# Patient Record
Sex: Female | Born: 1980 | Race: White | Hispanic: Yes | Smoking: Never smoker
Health system: Southern US, Community
[De-identification: ages and names within clinical notes are randomized; demographics above are authoritative.]

## PROBLEM LIST (undated history)

## (undated) DIAGNOSIS — F419 Anxiety disorder, unspecified: Secondary | ICD-10-CM

## (undated) DIAGNOSIS — I10 Essential (primary) hypertension: Secondary | ICD-10-CM

## (undated) HISTORY — DX: Anxiety disorder, unspecified: F41.9

## (undated) HISTORY — DX: Essential (primary) hypertension: I10

---

## 2010-03-29 DIAGNOSIS — F419 Anxiety disorder, unspecified: Secondary | ICD-10-CM

## 2010-03-29 HISTORY — DX: Anxiety disorder, unspecified: F41.9

## 2011-03-30 DIAGNOSIS — I10 Essential (primary) hypertension: Secondary | ICD-10-CM

## 2011-03-30 HISTORY — PX: TUBAL LIGATION: SHX77

## 2011-03-30 HISTORY — DX: Essential (primary) hypertension: I10

## 2016-01-28 ENCOUNTER — Ambulatory Visit: Payer: Self-pay | Admitting: Internal Medicine

## 2016-03-11 ENCOUNTER — Encounter: Payer: Self-pay | Admitting: Internal Medicine

## 2016-03-11 ENCOUNTER — Ambulatory Visit (INDEPENDENT_AMBULATORY_CARE_PROVIDER_SITE_OTHER): Payer: Self-pay | Admitting: Internal Medicine

## 2016-03-11 VITALS — BP 120/82 | HR 86 | Resp 12 | Ht 64.0 in | Wt 182.0 lb

## 2016-03-11 DIAGNOSIS — F419 Anxiety disorder, unspecified: Secondary | ICD-10-CM

## 2016-03-11 DIAGNOSIS — K029 Dental caries, unspecified: Secondary | ICD-10-CM

## 2016-03-11 DIAGNOSIS — F410 Panic disorder [episodic paroxysmal anxiety] without agoraphobia: Secondary | ICD-10-CM

## 2016-03-11 DIAGNOSIS — H547 Unspecified visual loss: Secondary | ICD-10-CM

## 2016-03-11 DIAGNOSIS — I10 Essential (primary) hypertension: Secondary | ICD-10-CM

## 2016-03-11 NOTE — Patient Instructions (Signed)
Call if you do not hear from me about your need for follow up in 6 weeks--need to get your records first regarding the problem in your right lung and your record for last pap smear

## 2016-03-11 NOTE — Progress Notes (Signed)
Subjective:    Patient ID: Patricia Brooks, female    DOB: Jul 09, 1980, 35 y.o.   MRN: [id number]  HPI   Here to establish Maritza's daughter  1.  Essential Hypertension:  Diagnosed 5 years ago after birth of daughter. Was taking Lisinopril 10 mg daily, but has been out for 7 months.  2.  Anxiety:  Describes panic attacks twice monthly. Hands shake, sweats a lot, chest pain, choking sensation and shortness of breath.  States her eyes get really red.   If has nowhere to go to lie down and be quiet alone, can last all day.  Generally occurs when at work.   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  Took unknown medication as needed for the panic episodes.  She does not have anything now to show us to ascertain name of medication, but sounds like a benzodiazepine.   Describes developing severe depression after last child born.  Treated with counseling, yoga, women's group in church.   She does not feel depressed currently. Does cry as she misses [place].  Wanted to buy a home for her children.  Too expensive in [place]  [facility]/11/2016  Decreased Interest 0  Down, Depressed, Hopeless 1  PHQ - 2 Score 1  Altered sleeping 2  Tired, decreased energy 3  Change in appetite 0  Feeling bad or failure about yourself  0  Trouble concentrating 0  Moving slowly or fidgety/restless 2  Suicidal thoughts 0  PHQ-9 Score 8    3.  Decreased visual acuity:  Would like referral to Optometry  No outpatient prescriptions have been marked as taking for the 03/11/16 encounter (Office Visit) with Julieanne MansonElizabeth Davine Coba, MD.   Allergies  Allergen Reactions  . Aspirin Swelling and Rash   Past Medical History:  Diagnosis Date  . Anxiety 2012   after last pregnancy  . Hypertension 2013   Previously on Lisinopril   Past Surgical History:  Procedure Laterality Date  . CESAREAN SECTION  2007, 2013  . TUBAL LIGATION  2013   [place]    Family History  Problem Relation Age of Onset  . Seizures Mother   . Kidney Stones Sister   . Blindness Son     Not clear if blind or just with poor vision  . Migraines Son   . Stroke Sister     Recurrent from valvular issue per patient   Social History   Social History  . Marital status: Divorced    Spouse name: N/A  . Number of children: 2  . Years of education: 12+   Occupational History  . Compare shelf stocker and meat dept.    Social History Main Topics  . Smoking status: Never Smoker  . Smokeless tobacco: Never Used  . Alcohol use No  . Drug use: No  . Sexual activity: Not Currently   Other Topics Concern  . Not on file   Social History Narrative   Originally from [place]   [place]   Lives at home with son and daughter.   [place] [facility] [place]              Review of Systems     Objective:   Physical Exam  NAD HEENT:  PERRL, EOMI, discs sharp, TMs pearly gray, throat without injection, some dental decay. Neck:  Supple, no adenopathy, no thyromegaly Chest:  CTA CV:  RRR with normal S1 and S2, No S3, S4 or murmur, radial and DP pulses normal and equal. LE: No edema         Assessment & Plan:  1.  History of Essential Hypertension:  No findings of elevated BP now off meds for 7 months.  May be related to anxiety.  Follow for now.  2.  Panic Attacks/anxiety:  Referral to [facility] for counseling/behavior modification.  Reticent to start medication as she does not feel depressed currently.  Discussed SSRI med is first line for panic disorder and depression.  3.  Dental Decay:  Dental referral  4.  Decreased visual acuity:  Optometry referral.

## 2016-04-07 ENCOUNTER — Telehealth: Payer: Self-pay | Admitting: Licensed Clinical Social Worker

## 2016-04-08 ENCOUNTER — Telehealth: Payer: Self-pay | Admitting: Licensed Clinical Social Worker

## 2016-04-08 NOTE — Telephone Encounter (Signed)
Social Work Intern called [facility] to offer counseling services. Services were declined by [facility]. She stated she was still interested in dental care. It was difficult to hear [facility] due to quality of service.

## 2016-05-24 ENCOUNTER — Ambulatory Visit (INDEPENDENT_AMBULATORY_CARE_PROVIDER_SITE_OTHER): Payer: Self-pay | Admitting: Internal Medicine

## 2016-05-24 ENCOUNTER — Encounter: Payer: Self-pay | Admitting: Internal Medicine

## 2016-05-24 VITALS — BP 150/100 | HR 72 | Resp 12 | Ht 65.0 in | Wt 178.0 lb

## 2016-05-24 DIAGNOSIS — Z124 Encounter for screening for malignant neoplasm of cervix: Secondary | ICD-10-CM

## 2016-05-24 DIAGNOSIS — B9689 Other specified bacterial agents as the cause of diseases classified elsewhere: Secondary | ICD-10-CM

## 2016-05-24 DIAGNOSIS — Z1231 Encounter for screening mammogram for malignant neoplasm of breast: Secondary | ICD-10-CM

## 2016-05-24 DIAGNOSIS — Z1239 Encounter for other screening for malignant neoplasm of breast: Secondary | ICD-10-CM

## 2016-05-24 DIAGNOSIS — R030 Elevated blood-pressure reading, without diagnosis of hypertension: Secondary | ICD-10-CM

## 2016-05-24 DIAGNOSIS — N750 Cyst of Bartholin's gland: Secondary | ICD-10-CM

## 2016-05-24 DIAGNOSIS — Z113 Encounter for screening for infections with a predominantly sexual mode of transmission: Secondary | ICD-10-CM

## 2016-05-24 DIAGNOSIS — N760 Acute vaginitis: Secondary | ICD-10-CM

## 2016-05-24 LAB — POCT WET PREP WITH KOH
KOH Prep POC: NEGATIVE
RBC Wet Prep HPF POC: NEGATIVE
TRICHOMONAS UA: NEGATIVE
YEAST WET PREP PER HPF POC: NEGATIVE

## 2016-05-24 MED ORDER — METRONIDAZOLE 500 MG PO TABS: ORAL_TABLET | ORAL | 0 refills | Status: DC

## 2016-05-24 NOTE — Progress Notes (Signed)
   Subjective:    Patient ID: Patricia Brooks, female    DOB: [birth year], 36 y.o.   MRN: [id number]  HPI   1.  Panic Attacks/Anxiety:  Never had time to get counseling.  States this is a non issue now.  2.  Has become sexually active with a boyfriend of about 1 month.  Has developed a cyst in her left labia.  Has what sounds like a history of cyst removed in same area.   Last pap was 4 years ago after the birth of her youngest. Thinks she had a hysterectomy, but has regular periods.  Previously gave me history that she had BTL, but she states that is not what she remembers now. Have sent for old records. Has not had a breast exam in last year. Has never had an abnormal pap. Has had a mammogram:  Cannot recall when, but had mammogram in 2017 as mother had history of breast cancer at her age Kandis Cocking(Maritza), which I was not aware of (in [place] per patient).   No outpatient prescriptions have been marked as taking for the 05/24/16 encounter (Office Visit) with Julieanne MansonElizabeth Julius Matus, MD.    Allergies  Allergen Reactions  . Aspirin Swelling and Rash      Review of Systems     Objective:   Physical Exam  Lungs:  CTA Breasts:  No focal mass, skin dimpling or axillary adenopathy CV:  RRR with normal S1 and S2, No S3, S4, or murmur, radial and DP pulses normal and equal Abd:  S, NT, No HSM or mass, + BS.  Well healed low transverse surgical scar. GU:  2 cmx 1.5 cm ovoid cystic lesion palpated deep to left labia majora.  The labia is swollen, but no erythema.  A bit tender to deep palpation. White thin vaginal discharge with amine odor, No underlying erythema Cervix without lesion. No CMT.  Pap taken.  No uterine or adnexal mass or tenderness.          Assessment & Plan:  1.  Pap and Breast exam today:  Not clear about family history.  Patient unwilling to clarify with her mother.  Doesn't want to get in her "business"  Kandis CockingMaritza is currently traveling and unable to contact.  2.  BV:   Metronidazole 500 mg twice daily for 7 days.  3.  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

## 2016-05-25 LAB — RPR QUALITATIVE: RPR Ser Ql: NONREACTIVE

## 2016-05-25 LAB — HIV ANTIBODY (ROUTINE TESTING W REFLEX): HIV SCREEN 4TH GENERATION: NONREACTIVE

## 2016-05-26 LAB — GC/CHLAMYDIA PROBE AMP
CHLAMYDIA, DNA PROBE: NEGATIVE
Neisseria gonorrhoeae by PCR: NEGATIVE

## 2016-05-26 LAB — CYTOLOGY - PAP

## 2016-06-07 ENCOUNTER — Encounter: Payer: Self-pay | Admitting: Internal Medicine

## 2016-06-10 ENCOUNTER — Ambulatory Visit: Payer: Self-pay | Admitting: Internal Medicine

## 2016-10-07 NOTE — Addendum Note (Signed)
Addended by: Marcelino FreestoneHAZLEY, Talah Cookston on: 10/07/2016 04:46 PM   Modules accepted: Orders

## 2016-11-22 ENCOUNTER — Encounter: Payer: Self-pay | Admitting: Obstetrics and Gynecology

## 2017-03-17 ENCOUNTER — Other Ambulatory Visit: Payer: Self-pay

## 2017-03-17 ENCOUNTER — Ambulatory Visit (INDEPENDENT_AMBULATORY_CARE_PROVIDER_SITE_OTHER): Payer: Self-pay | Admitting: Physician Assistant

## 2017-03-17 ENCOUNTER — Encounter (INDEPENDENT_AMBULATORY_CARE_PROVIDER_SITE_OTHER): Payer: Self-pay | Admitting: Physician Assistant

## 2017-03-17 VITALS — BP 159/107 | HR 84 | Temp 98.2°F | Ht 64.5 in | Wt 167.8 lb

## 2017-03-17 DIAGNOSIS — I10 Essential (primary) hypertension: Secondary | ICD-10-CM

## 2017-03-17 DIAGNOSIS — Q339 Congenital malformation of lung, unspecified: Secondary | ICD-10-CM

## 2017-03-17 DIAGNOSIS — J069 Acute upper respiratory infection, unspecified: Secondary | ICD-10-CM

## 2017-03-17 DIAGNOSIS — F418 Other specified anxiety disorders: Secondary | ICD-10-CM

## 2017-03-17 MED ORDER — LISINOPRIL-HYDROCHLOROTHIAZIDE 10-12.5 MG PO TABS: 1.0000 | ORAL_TABLET | Freq: Every day | ORAL | 3 refills | Status: DC

## 2017-03-17 MED ORDER — ESCITALOPRAM OXALATE 10 MG PO TABS: 10.0000 mg | ORAL_TABLET | Freq: Every day | ORAL | 1 refills | Status: DC

## 2017-03-17 MED ORDER — PHENYLEPHRINE-DM-GG-APAP 5-10-200-325 MG/10ML PO LIQD: 20.0000 mL | ORAL | 0 refills | Status: AC

## 2017-03-17 MED FILL — LISINOPRIL-HCTZ 10-12.5 MG: 10-12.5 | 30 days supply | Qty: 30 | Fill #0

## 2017-03-17 MED FILL — ESCITALOPRAM 10 MG TABLET: 10 | 30 days supply | Qty: 30 | Fill #0

## 2017-03-17 NOTE — Progress Notes (Signed)
Subjective:  Patient ID: Patricia Brooks, female    DOB: [birth year]  Age: 36 y.o. MRN: [id number]  CC: HTN and nasal congestion  HPI Patricia Brooks is a 36 y.o. female with a medical history of anxiety, panic attacks, and HTN presents as a new patient with complaint of nasal congestion x2 days. No other symptoms reported. No close contacts with the same.    Has had few occurrences of panic attacks. Attributed to the death of her grandfather whom died in her arms after performing CPR. She feels her heart palpitating, feels nervous, and feels like screaming. She also used to work 4 jobs for many months in order to "reach a goal". During that time she had only slept one hour a day. Quit all her jobs except one after her goal was met. Currently working 40 hours a week and spending more time with her children. Used to take escitalopram which was helpful in reducing anxiety. However, she does not think she is anxious. Thought escitalopram was for HTN. Denies suicidality and says it was in error that she marked suicidal on the PHQ9.    Says she did a CXR ordered by her previous provider and was found to have a suspicious lung lesion. She is worried she may have a serious problem as she occasionally feels short of breath. Does not know exactly what was found nor does she have records for review.       Outpatient Medications Prior to Visit  Medication Sig Dispense Refill  . metroNIDAZOLE (FLAGYL) 500 MG tablet 1 tab by mouth twice daily for 7 days. (Patient not taking: Reported on 03/17/2017) 14 tablet 0   No facility-administered medications prior to visit.      ROS Review of Systems  Constitutional: Negative for chills, fever and malaise/fatigue.  Eyes: Negative for blurred vision.  Respiratory: Negative for shortness of breath.   Cardiovascular: Negative for chest pain and palpitations.  Gastrointestinal: Negative for abdominal pain and nausea.  Genitourinary: Negative for dysuria and  hematuria.  Musculoskeletal: Negative for joint pain and myalgias.  Skin: Negative for rash.  Neurological: Negative for tingling and headaches.  Psychiatric/Behavioral: Negative for depression. The patient is not nervous/anxious.     Objective:  BP (!) 159/107 (BP Location: Right Arm, Patient Position: Sitting, Cuff Size: Normal)   Pulse 84   Temp 98.2 F (36.8 C) (Oral)   Ht 5' 4.5" (1.638 m)   Wt 167 lb 12.8 oz (76.1 kg)   LMP 02/01/2017 (Approximate)   SpO2 100%   BMI 28.36 kg/m   BP/Weight 03/17/2017 05/24/2016 41/96/2229  Systolic BP 798 921 194  Diastolic BP 174 081 82  Wt. (Lbs) 167.8 178 182  BMI 28.36 29.62 31.24      Physical Exam  Constitutional: She is oriented to person, place, and time.  Well developed, well nourished, NAD, polite  HENT:  Head: Normocephalic and atraumatic.  Eyes: No scleral icterus.  Neck: Normal range of motion. Neck supple. No thyromegaly present.  Cardiovascular: Normal rate, regular rhythm and normal heart sounds.  Pulmonary/Chest: Effort normal and breath sounds normal.  Musculoskeletal: She exhibits no edema.  Neurological: She is alert and oriented to person, place, and time. No cranial nerve deficit. Coordination normal.  Skin: Skin is warm and dry. No rash noted. No erythema. No pallor.  Psychiatric: Her behavior is normal. Thought content normal.  Poor judgement and insight. Defensive and contradictory. Seems mildly anxious.   Vitals reviewed.    Assessment & Plan:  1. Depression with anxiety - escitalopram (LEXAPRO) 10 MG tablet; Take 1 tablet (10 mg total) by mouth daily.  Dispense: 30 tablet; Refill: 1 - CBC with Differential - Comprehensive metabolic panel - TSH  2. Hypertension, unspecified type - lisinopril-hydrochlorothiazide (PRINZIDE,ZESTORETIC) 10-12.5 MG tablet; Take 1 tablet by mouth daily.  Dispense: 90 tablet; Refill: 3 - CBC with Differential - Comprehensive metabolic panel - TSH  3. Acute upper  respiratory infection - Phenylephrine-DM-GG-APAP (MUCINEX FAST-MAX COLD FLU) 5-10-200-325 MG/10ML LIQD; Take 20 mLs by mouth every 4 (four) hours for 5 days.  Dispense: 1 Bottle; Refill: 0  4. Lung anomaly - DG Chest 2 View; Future - I have asked patient to fill a medical record release authorization so that her first imaging and report could be faxed to [place].    *Pt is very difficult to work with. Has very poor insight and knowledge. Very defensive and does not readily accept medical opinion. I have advised that she take escitalopram as it seems to have helped her before when prescribed by another provider.   Meds ordered this encounter  Medications  . lisinopril-hydrochlorothiazide (PRINZIDE,ZESTORETIC) 10-12.5 MG tablet    Sig: Take 1 tablet by mouth daily.    Dispense:  90 tablet    Refill:  3    Order Specific Question:   Supervising Provider    Answer:   Tresa Garter [id number]  . escitalopram (LEXAPRO) 10 MG tablet    Sig: Take 1 tablet (10 mg total) by mouth daily.    Dispense:  30 tablet    Refill:  1    Order Specific Question:   Supervising Provider    Answer:   Tresa Garter [id number]  . Phenylephrine-DM-GG-APAP (MUCINEX FAST-MAX COLD FLU) 5-10-200-325 MG/10ML LIQD    Sig: Take 20 mLs by mouth every 4 (four) hours for 5 days.    Dispense:  1 Bottle    Refill:  0    Order Specific Question:   Supervising Provider    Answer:   Tresa Garter [id number]    Follow-up: Return in about 6 weeks (around 04/28/2017) for anxiety.   Clent Demark PA

## 2017-03-17 NOTE — Patient Instructions (Signed)
Trastorno de ansiedad generalizada, en adultos  Generalized Anxiety Disorder, Adult  El trastorno de ansiedad generalizada (TAG) es un trastorno de salud mental. Las personas con esta afeccin se preocupan constantemente por los eventos de todos los das. A diferencia de la ansiedad normal, la preocupacin relacionada con el TAG no se produce por un evento especfico. Estas preocupaciones tampoco desaparecen ni mejoran con el tiempo. EL TAG interfiere con las funciones de la vida, incluidas las relaciones, el trabajo y la escuela.  El TAG puede variar de leve a grave. Las personas con TAG grave tienen intensas oleadas de ansiedad con sntomas fsicos (crisis de angustia).  Cules son las causas?  Se desconoce la causa exacta del TAG.  Qu incrementa el riesgo?  Es ms probable que esta afeccin se manifieste en:   Mujeres.   Las personas que tienen antecedentes familiares de trastornos de ansiedad.   Las personas que son muy tmidas.   Las personas que experimentan eventos muy estresantes en la vida, tales como la muerte de un ser querido.   Las personas que tienen un entorno familiar muy estresante.    Cules son los signos o los sntomas?  Con frecuencia, las personas que padecen el TAG se preocupan excesivamente por muchas cosas en la vida, tales como su salud y su familia. Tambin pueden experimentar una preocupacin desmedida por lo siguiente:   Hacer las cosas bien.   Llegar a tiempo.   Los desastres naturales.   Las amistades.    Los sntomas fsicos del TAG incluyen:   Fatiga.   Tensin muscular o contracciones musculares.   Temblores o agitacin.   Sobresaltarse con facilidad.   Sentir que el corazn late fuerte o est acelerado.   Sentir falta de aire o como que no se puede respirar profundamente.   Problemas para quedarse dormido o para seguir durmiendo.   Sudoracin.   Nuseas, diarrea o sndrome del intestino irritable (SII).   Dolores de cabeza.   Dificultad para concentrarse o  recordar hechos.   Agitacin.   Irritabilidad.    Cmo se diagnostica?  El mdico puede diagnosticar el TAG en funcin de los sntomas y los antecedentes mdicos. Se le realizar un examen fsico. El mdico le har preguntas especficas sobre sus sntomas, que incluyen qu tan graves son, cundo comenzaron y si van y vienen. Tambin puede hacerle preguntas sobre el consumo de alcohol o drogas, incluidos los medicamentos recetados. Su mdico podr derivarlo a un especialista de salud mental para ms evaluaciones.  Su mdico le realizar un examen exhaustivo y puede hacer pruebas adicionales para descartar otras posibles causas de sus sntomas.  Para recibir un diagnstico del TAG, una persona debe tener ansiedad que:   Est fuera de su control.   Afecte distintos aspectos de su vida, como el trabajo y las relaciones.   Cause angustia que le impida participar en sus actividades habituales.   Incluya al menos tres sntomas fsicos del TAG tales como inquietud, fatiga, dificultad para concentrarse, irritabilidad, tensin muscular o problemas para dormir.    Antes de que su mdico pueda confirmar un diagnstico del TAG, estos sntomas deben estar presentes ms das de los que no lo estn y deben tener una duracin de seis meses o ms.  Cmo se trata?  Las siguientes terapias se usan generalmente para tratar el TAG:   Medicamentos. Por lo general, los medicamentos antidepresivos se recetan para un control diario a largo plazo. Se pueden agregar medicamentos para la ansiedad en casos   graves, especialmente cuando ocurren crisis de angustia.   Psicoterapia (psicoanlisis). Determinados tipos de psicoterapia pueden ser tiles para tratar el TAG al brindar apoyo, educacin y orientacin. Entre las opciones se incluyen las siguientes:  ? Terapia cognitivo conductual (TCC). Las personas aprenden habilidades para sobrellevar situaciones y tcnicas para aliviar la ansiedad. Aprenden a identificar conductas y pensamientos  irreales o negativos, y a reemplazarlos por positivos.  ? Terapia de aceptacin y compromiso (acceptance and commitment therapy, ACT). Este tratamiento ensea a las personas a ser conscientes como una forma de lidiar con pensamientos y sentimientos no deseados.  ? Biorretroalimentacin. Este proceso lo capacita para controlar la respuesta del cuerpo (respuesta psicolgica) a travs de tcnicas de respiracin y mtodos de relajacin. Usted trabajar con un terapeuta mientras se usan mquinas para controlar sus sntomas fsicos.   Tcnicas para controlar el estrs. Estas incluyen yoga, meditacin y ejercicio.    Un especialista en salud mental puede ayudar a determinar qu tratamiento es el mejor para usted. Algunas personas pueden mejorar con solo un tipo de terapia. Sin embargo, otras personas requieren una combinacin de terapias.  Siga estas instrucciones en su casa:   Tome los medicamentos de venta libre y los recetados solamente como se lo haya indicado el mdico.   Trate de mantener una rutina normal.   Trate de anticipar situaciones estresantes y permita un tiempo adicional para controlarlas.   Participe de cualquier tcnica para autocalmarse o controlar el estrs segn las indicaciones de su mdico.   No se castigue ante los retrocesos o por no realizar progresos.   Trate de reconocer sus logros aunque sean pequeos.   Concurra a todas las visitas de seguimiento como se lo haya indicado el mdico. Esto es importante.  Comunquese con un mdico si:   Los sntomas no mejoran.   Los sntomas empeoran.   Tiene signos de depresin tales como:  ? Tristeza, mal humor o irritabilidad.  ? Ya no disfruta de actividades que le solan causar placer.  ? Cambios en el peso y o en sus hbitos de alimentacin.  ? Cambios en los hbitos de sueo.  ? Evita a amigos y familiares.  ? No tiene energa para realizar las tareas habituales.  ? Tiene sentimientos de culpa o de minusvala.  Solicite ayuda de inmediato  si:   Tiene pensamientos serios acerca de lastimarse a usted mismo o daar a otras personas.  Si alguna vez siente que puede lastimarse o lastimar a los dems, o piensa en poner fin a su vida, busque ayuda de inmediato. Puede dirigirse al servicio de urgencias ms cercano o comunicarse con:   El servicio de emergencias de su localidad (911 en los [place]).   Una lnea de asistencia al suicida y atencin en crisis, como la Lnea Nacional de Prevencin del Suicidio (National Suicide Prevention Lifeline) al 1-800-273-8255. Est disponible las 24 horas del da.    Resumen   El trastorno de ansiedad generalizada (TAG) es un trastorno de salud mental que implica preocupacin no causada por un evento especfico.   Con frecuencia, las personas que padecen el TAG se preocupan excesivamente por muchas cosas en la vida, tales como su salud y su familia.   El TAG puede causar sntomas fsicos tales como inquietud, dificultad para concentrarse, problemas para dormir, sudoracin frecuente, nuseas, diarrea, dolores de cabeza y temblores, o contracciones musculares.   Un especialista en salud mental puede ayudar a determinar qu tratamiento es el mejor para usted. Algunas personas pueden mejorar   con solo un tipo de terapia. Sin embargo, otras personas requieren una combinacin de terapias.  Esta informacin no tiene como fin reemplazar el consejo del mdico. Asegrese de hacerle al mdico cualquier pregunta que tenga.  Document Released: 07/10/2012 Document Revised: 06/18/2016 Document Reviewed: 06/18/2016  Elsevier Interactive Patient Education  2018 [employer]

## 2017-03-18 ENCOUNTER — Other Ambulatory Visit (INDEPENDENT_AMBULATORY_CARE_PROVIDER_SITE_OTHER): Payer: Self-pay | Admitting: Physician Assistant

## 2017-03-18 ENCOUNTER — Telehealth (INDEPENDENT_AMBULATORY_CARE_PROVIDER_SITE_OTHER): Payer: Self-pay

## 2017-03-18 DIAGNOSIS — D509 Iron deficiency anemia, unspecified: Secondary | ICD-10-CM

## 2017-03-18 LAB — CBC WITH DIFFERENTIAL/PLATELET
Basophils Absolute: 0 10*3/uL (ref 0.0–0.2)
Basos: 1 %
EOS (ABSOLUTE): 0.1 10*3/uL (ref 0.0–0.4)
EOS: 3 %
HEMATOCRIT: 33.2 % — AB (ref 34.0–46.6)
HEMOGLOBIN: 9.9 g/dL — AB (ref 11.1–15.9)
Immature Grans (Abs): 0 10*3/uL (ref 0.0–0.1)
Immature Granulocytes: 0 %
LYMPHS ABS: 0.9 10*3/uL (ref 0.7–3.1)
Lymphs: 21 %
MCH: 21.9 pg — AB (ref 26.6–33.0)
MCHC: 29.8 g/dL — AB (ref 31.5–35.7)
MCV: 73 fL — AB (ref 79–97)
MONOCYTES: 13 %
Monocytes Absolute: 0.6 10*3/uL (ref 0.1–0.9)
NEUTROS ABS: 2.7 10*3/uL (ref 1.4–7.0)
Neutrophils: 62 %
Platelets: 258 10*3/uL (ref 150–379)
RBC: 4.53 x10E6/uL (ref 3.77–5.28)
RDW: 17.6 % — ABNORMAL HIGH (ref 12.3–15.4)
WBC: 4.4 10*3/uL (ref 3.4–10.8)

## 2017-03-18 LAB — COMPREHENSIVE METABOLIC PANEL
ALBUMIN: 4.5 g/dL (ref 3.5–5.5)
ALK PHOS: 61 IU/L (ref 39–117)
ALT: 12 IU/L (ref 0–32)
AST: 15 IU/L (ref 0–40)
Albumin/Globulin Ratio: 1.4 (ref 1.2–2.2)
BILIRUBIN TOTAL: 0.2 mg/dL (ref 0.0–1.2)
BUN / CREAT RATIO: 12 (ref 9–23)
BUN: 8 mg/dL (ref 6–20)
CHLORIDE: 104 mmol/L (ref 96–106)
CO2: 24 mmol/L (ref 20–29)
Calcium: 9.3 mg/dL (ref 8.7–10.2)
Creatinine, Ser: 0.65 mg/dL (ref 0.57–1.00)
GFR calc Af Amer: 132 mL/min/{1.73_m2} (ref >59)
GFR calc non Af Amer: 115 mL/min/{1.73_m2} (ref >59)
GLOBULIN, TOTAL: 3.2 g/dL (ref 1.5–4.5)
Glucose: 99 mg/dL (ref 65–99)
Potassium: 4.2 mmol/L (ref 3.5–5.2)
SODIUM: 140 mmol/L (ref 134–144)
Total Protein: 7.7 g/dL (ref 6.0–8.5)

## 2017-03-18 LAB — TSH: TSH: 1.67 u[IU]/mL (ref 0.450–4.500)

## 2017-03-18 MED ORDER — FERROUS SULFATE 325 (65 FE) MG PO TBEC: 325.0000 mg | DELAYED_RELEASE_TABLET | Freq: Three times a day (TID) | ORAL | 0 refills | Status: DC

## 2017-03-18 NOTE — Telephone Encounter (Signed)
-----   Message from Loletta Specteroger David Gomez, PA-C sent at 03/18/2017  8:51 AM EST ----- Patient is anemic. I sent iron pills to [facility].

## 2017-03-18 NOTE — Telephone Encounter (Signed)
Left a message informing patient of being anemic and iron pills being sent to [facility] pharmacy and to call office with ay questions. Maryjean Mornempestt S Christell Steinmiller, CMA

## 2017-04-28 ENCOUNTER — Ambulatory Visit (INDEPENDENT_AMBULATORY_CARE_PROVIDER_SITE_OTHER): Payer: Self-pay | Admitting: Physician Assistant

## 2017-06-16 ENCOUNTER — Ambulatory Visit (INDEPENDENT_AMBULATORY_CARE_PROVIDER_SITE_OTHER): Payer: Self-pay | Admitting: Physician Assistant

## 2017-06-16 ENCOUNTER — Other Ambulatory Visit: Payer: Self-pay

## 2017-06-16 ENCOUNTER — Encounter (INDEPENDENT_AMBULATORY_CARE_PROVIDER_SITE_OTHER): Payer: Self-pay | Admitting: Physician Assistant

## 2017-06-16 VITALS — BP 173/108 | HR 70 | Temp 98.2°F | Wt 163.8 lb

## 2017-06-16 DIAGNOSIS — Z91199 Patient's noncompliance with other medical treatment and regimen due to unspecified reason: Secondary | ICD-10-CM

## 2017-06-16 DIAGNOSIS — N750 Cyst of Bartholin's gland: Secondary | ICD-10-CM

## 2017-06-16 DIAGNOSIS — F419 Anxiety disorder, unspecified: Secondary | ICD-10-CM

## 2017-06-16 DIAGNOSIS — Z9119 Patient's noncompliance with other medical treatment and regimen: Secondary | ICD-10-CM

## 2017-06-16 DIAGNOSIS — I10 Essential (primary) hypertension: Secondary | ICD-10-CM

## 2017-06-16 MED ORDER — ESCITALOPRAM OXALATE 10 MG PO TABS: 10.0000 mg | ORAL_TABLET | Freq: Every day | ORAL | 1 refills | Status: DC

## 2017-06-16 MED ORDER — DOXYCYCLINE HYCLATE 100 MG PO CAPS: 100.0000 mg | ORAL_CAPSULE | Freq: Two times a day (BID) | ORAL | 0 refills | Status: DC

## 2017-06-16 MED ORDER — CLONAZEPAM 0.25 MG PO TBDP: 0.2500 mg | ORAL_TABLET | Freq: Two times a day (BID) | ORAL | 0 refills | Status: DC

## 2017-06-16 MED ORDER — LISINOPRIL-HYDROCHLOROTHIAZIDE 10-12.5 MG PO TABS: 1.0000 | ORAL_TABLET | Freq: Every day | ORAL | 3 refills | Status: DC

## 2017-06-16 NOTE — Patient Instructions (Signed)

## 2017-06-16 NOTE — Progress Notes (Signed)
Subjective:  Patient ID: Patricia Brooks, female    DOB: June 12, 1980  Age: 37 y.o. MRN: [id number]  CC: blister on vulva  HPI Patricia Brooks is a 37 y.o. female with a medical history of anxiety and HTN presents with complaint of blister on the vulva and a growth on the left labia majora. Patient is fearful she has a recurrence of a Bartholin gland cyst. Complains of dyspareunia and pain on contact but otherwise painless. No suppuration or bleeding of the cyst. No vaginal drainage, pelvic pains, or constitutional symptoms.     Patient also reports she is not taking her escitalopram or clonazepam. Said clonazepam had not been dispensed to her. She thought if she did not take clonazepam then there was no use in taking escitalopram.       Outpatient Medications Prior to Visit  Medication Sig Dispense Refill  . ferrous sulfate 325 (65 FE) MG EC tablet Take 1 tablet (325 mg total) by mouth 3 (three) times daily with meals. 90 tablet 0  . escitalopram (LEXAPRO) 10 MG tablet Take 1 tablet (10 mg total) by mouth daily. (Patient not taking: Reported on 06/16/2017) 30 tablet 1  . lisinopril-hydrochlorothiazide (PRINZIDE,ZESTORETIC) 10-12.5 MG tablet Take 1 tablet by mouth daily. (Patient not taking: Reported on 06/16/2017) 90 tablet 3   No facility-administered medications prior to visit.      ROS Review of Systems  Constitutional: Negative for chills, fever and malaise/fatigue.  Eyes: Negative for blurred vision.  Respiratory: Negative for shortness of breath.   Cardiovascular: Negative for chest pain and palpitations.  Gastrointestinal: Negative for abdominal pain and nausea.  Genitourinary: Negative for dysuria and hematuria.       Vaginal discomfort  Musculoskeletal: Negative for joint pain and myalgias.  Skin: Negative for rash.  Neurological: Negative for tingling and headaches.  Psychiatric/Behavioral: Negative for depression. The patient is nervous/anxious.     Objective:  BP (!)  173/108 (BP Location: Left Arm, Patient Position: Sitting, Cuff Size: Normal)   Pulse 70   Temp 98.2 F (36.8 C) (Oral)   Wt 163 lb 12.8 oz (74.3 kg)   LMP 06/09/2017 (Exact Date)   SpO2 100%   BMI 27.68 kg/m   BP/Weight 06/16/2017 03/17/2017 05/24/2016  Systolic BP 173 159 150  Diastolic BP 108 107 100  Wt. (Lbs) 163.8 167.8 178  BMI 27.68 28.36 29.62      Physical Exam  Constitutional: She is oriented to person, place, and time.  Well developed, well nourished, NAD, polite  HENT:  Head: Normocephalic and atraumatic.  Eyes: No scleral icterus.  Cardiovascular: Normal rate, regular rhythm and normal heart sounds.  Pulmonary/Chest: Effort normal.  Genitourinary:  Genitourinary Comments: Left sided bartholin gland cyst with mild TTP, no suppuration, no bleeding. Small skin tag on left labia majora  Musculoskeletal: She exhibits no edema.  Neurological: She is alert and oriented to person, place, and time.  Skin: Skin is warm and dry. No rash noted. No erythema. No pallor.  Psychiatric: Thought content normal.  Somewhat nervous  Vitals reviewed.    Assessment & Plan:    1. Bartholin gland cyst - doxycycline (VIBRAMYCIN) 100 MG capsule; Take 1 capsule (100 mg total) by mouth 2 (two) times daily.  Dispense: 20 capsule; Refill: 0 - referral to Gynecology  2. Hypertension, unspecified type - lisinopril-hydrochlorothiazide (PRINZIDE,ZESTORETIC) 10-12.5 MG tablet; Take 1 tablet by mouth daily.  Dispense: 90 tablet; Refill: 3  3. Anxiety - escitalopram (LEXAPRO) 10 MG tablet; Take 1 tablet (  10 mg total) by mouth daily.  Dispense: 90 tablet; Refill: 1 - clonazePAM (KLONOPIN) 0.25 MG disintegrating tablet; Take 1 tablet (0.25 mg total) by mouth 2 (two) times daily.  Dispense: 30 tablet; Refill: 0  4. Noncompliance - Advised patient to take medications as directed.    Meds ordered this encounter  Medications  . lisinopril-hydrochlorothiazide (PRINZIDE,ZESTORETIC) 10-12.5 MG  tablet    Sig: Take 1 tablet by mouth daily.    Dispense:  90 tablet    Refill:  3    Order Specific Question:   Supervising Provider    Answer:   Quentin Angst [id number]  . escitalopram (LEXAPRO) 10 MG tablet    Sig: Take 1 tablet (10 mg total) by mouth daily.    Dispense:  90 tablet    Refill:  1    Order Specific Question:   Supervising Provider    Answer:   Quentin Angst [id number]  . clonazePAM (KLONOPIN) 0.25 MG disintegrating tablet    Sig: Take 1 tablet (0.25 mg total) by mouth 2 (two) times daily.    Dispense:  30 tablet    Refill:  0    Order Specific Question:   Supervising Provider    Answer:   Quentin Angst [id number]  . doxycycline (VIBRAMYCIN) 100 MG capsule    Sig: Take 1 capsule (100 mg total) by mouth 2 (two) times daily.    Dispense:  20 capsule    Refill:  0    Order Specific Question:   Supervising Provider    Answer:   Quentin Angst [[id number]]    Follow-up: Return in about 1 month (around 07/14/2017) for HTN.   Loletta Specter PA

## 2017-07-21 ENCOUNTER — Ambulatory Visit (INDEPENDENT_AMBULATORY_CARE_PROVIDER_SITE_OTHER): Payer: Self-pay | Admitting: Physician Assistant

## 2017-09-02 ENCOUNTER — Emergency Department (HOSPITAL_COMMUNITY)
Admission: EM | Admit: 2017-09-02 | Discharge: 2017-09-02 | Disposition: A | Discharge status: Home / Self Care | Payer: Self-pay | Attending: Physician Assistant | Admitting: Physician Assistant

## 2017-09-02 ENCOUNTER — Other Ambulatory Visit: Payer: Self-pay

## 2017-09-02 ENCOUNTER — Emergency Department (HOSPITAL_COMMUNITY): Payer: Self-pay

## 2017-09-02 ENCOUNTER — Encounter (HOSPITAL_COMMUNITY): Payer: Self-pay | Admitting: *Deleted

## 2017-09-02 DIAGNOSIS — F410 Panic disorder [episodic paroxysmal anxiety] without agoraphobia: Secondary | ICD-10-CM | POA: Insufficient documentation

## 2017-09-02 DIAGNOSIS — F419 Anxiety disorder, unspecified: Secondary | ICD-10-CM

## 2017-09-02 DIAGNOSIS — I10 Essential (primary) hypertension: Secondary | ICD-10-CM | POA: Insufficient documentation

## 2017-09-02 DIAGNOSIS — Z79899 Other long term (current) drug therapy: Secondary | ICD-10-CM | POA: Insufficient documentation

## 2017-09-02 LAB — URINALYSIS, ROUTINE W REFLEX MICROSCOPIC
BILIRUBIN URINE: NEGATIVE
GLUCOSE, UA: NEGATIVE mg/dL
Ketones, ur: NEGATIVE mg/dL
LEUKOCYTES UA: NEGATIVE
NITRITE: NEGATIVE
PROTEIN: NEGATIVE mg/dL
Specific Gravity, Urine: 1.008 (ref 1.005–1.030)
pH: 7 (ref 5.0–8.0)

## 2017-09-02 LAB — BASIC METABOLIC PANEL
Anion gap: 10 (ref 5–15)
BUN: 6 mg/dL (ref 6–20)
CO2: 26 mmol/L (ref 22–32)
Calcium: 9.2 mg/dL (ref 8.9–10.3)
Chloride: 104 mmol/L (ref 101–111)
Creatinine, Ser: 0.65 mg/dL (ref 0.44–1.00)
GFR calc Af Amer: >60 mL/min (ref >60)
GLUCOSE: 86 mg/dL (ref 65–99)
POTASSIUM: 3 mmol/L — AB (ref 3.5–5.1)
Sodium: 140 mmol/L (ref 135–145)

## 2017-09-02 LAB — CBC
HEMATOCRIT: 34.8 % — AB (ref 36.0–46.0)
Hemoglobin: 10.4 g/dL — ABNORMAL LOW (ref 12.0–15.0)
MCH: 23 pg — AB (ref 26.0–34.0)
MCHC: 29.9 g/dL — AB (ref 30.0–36.0)
MCV: 76.8 fL — AB (ref 78.0–100.0)
Platelets: 299 10*3/uL (ref 150–400)
RBC: 4.53 MIL/uL (ref 3.87–5.11)
RDW: 17 % — AB (ref 11.5–15.5)
WBC: 4.3 10*3/uL (ref 4.0–10.5)

## 2017-09-02 LAB — RAPID URINE DRUG SCREEN, HOSP PERFORMED
Amphetamines: NOT DETECTED
Barbiturates: NOT DETECTED
Benzodiazepines: NOT DETECTED
Cocaine: NOT DETECTED
OPIATES: NOT DETECTED
TETRAHYDROCANNABINOL: NOT DETECTED

## 2017-09-02 LAB — I-STAT TROPONIN, ED: Troponin i, poc: 0 ng/mL (ref 0.00–0.08)

## 2017-09-02 LAB — I-STAT BETA HCG BLOOD, ED (MC, WL, AP ONLY): I-stat hCG, quantitative: <5 m[IU]/mL (ref <5)

## 2017-09-02 MED ORDER — ESCITALOPRAM OXALATE 10 MG PO TABS: 10.0000 mg | ORAL_TABLET | Freq: Every day | ORAL | 0 refills | Status: DC

## 2017-09-02 MED ORDER — POTASSIUM CHLORIDE CRYS ER 20 MEQ PO TBCR
40.0000 meq | EXTENDED_RELEASE_TABLET | Freq: Once | ORAL | Status: AC
  Administered 2017-09-02: 40 meq via ORAL
  Filled 2017-09-02: qty 2

## 2017-09-02 NOTE — ED Triage Notes (Signed)
Pt in stating she has multiple episodes of anxiety and hypertension, states she did not take her BP medication this am and started having anxiety attack at work, also concerned that her BP was elevated, pt reports she had an episode of blurred vision while at work today, pt states she couldn't feel her feet, pt was hyperventilating during this episode, pt was hyperventilating and crying on arrival to ED, unable to control her behavior- pt has calmed down at this time and is speaking in normal sentences and states she feels better. Pt did take her mothers BP medication today, which is different than the medication that she normally takes but she said it helped her calm down.

## 2017-09-02 NOTE — Discharge Instructions (Addendum)
You were seen today after an episode of chest pain, shortness of breath, nausea. This was most likely from anxiety. We are glad that all your symptoms have resolved.  Your heart and lungs looks normal here.  Your blood work is also normal except for low potassium you have received an extra potassium pill while you are here.  You will need to resume your anxiety medicine.  We have given you a refill for 30 days.  Please see your PCP as soon as possible for visit to discuss anxiety and blood pressure. Take care!
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
## 2017-09-05 MED FILL — DOXYCYCLINE 100 MG TABLET: 100 | 10 days supply | Qty: 20 | Fill #0

## 2017-09-05 MED FILL — ESCITALOPRAM 10 MG TABLET: 10 | 30 days supply | Qty: 30 | Fill #0

## 2017-09-05 MED FILL — LISINOPRIL-HCTZ 10-12.5 MG: 10-12.5 | 30 days supply | Qty: 30 | Fill #0

## 2017-09-12 ENCOUNTER — Ambulatory Visit: Payer: Self-pay

## 2018-06-12 IMAGING — DX DG CHEST 2V
2 series · 2 of 2 positions shown · non-contrast
Comparison: None.

CLINICAL DATA: Shortness of breath and chest pressure

EXAM:
CHEST - 2 VIEW

[w chest pa]
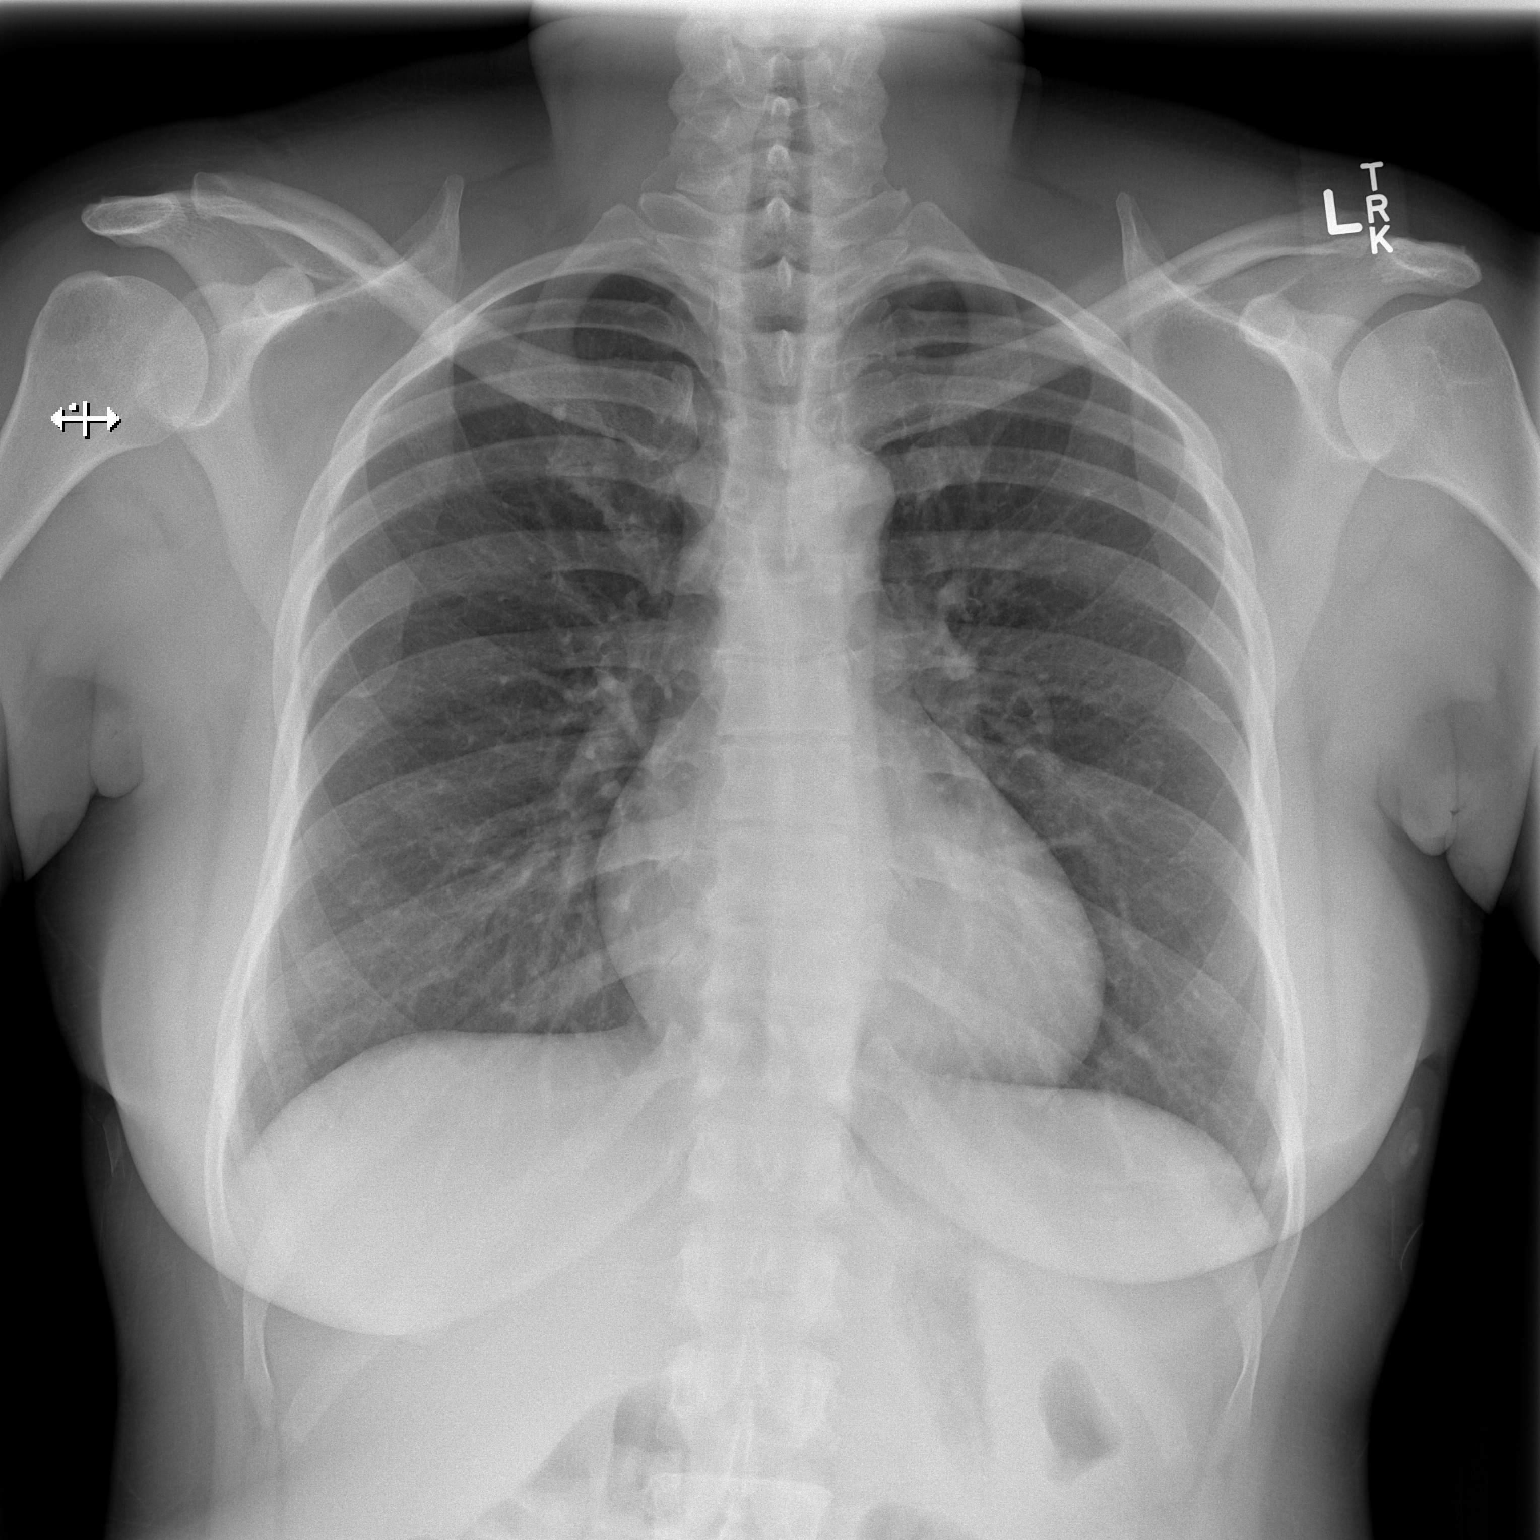

[w chest lat]
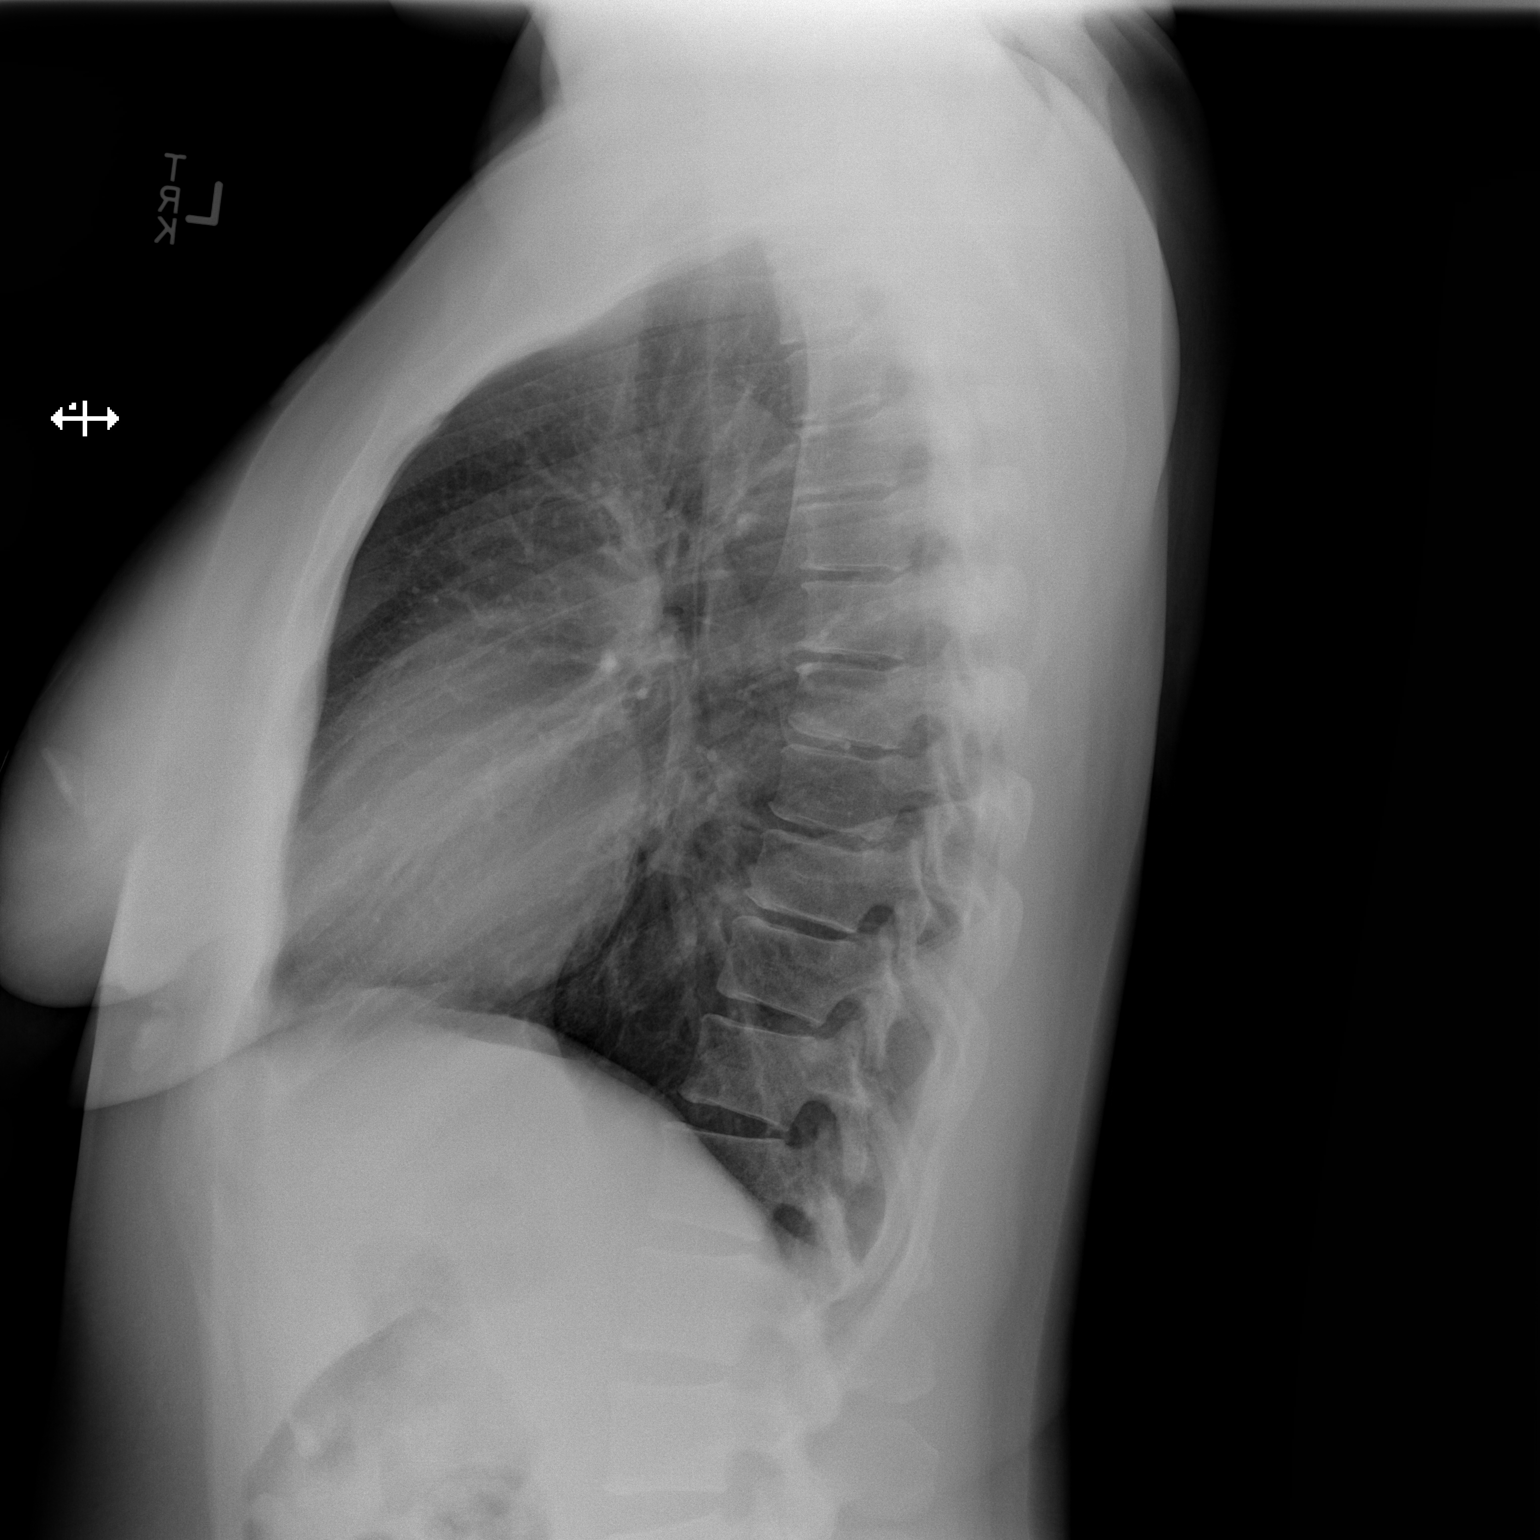

[2 of 2 positions shown; findings below may reference images not displayed]

FINDINGS: Lungs are clear. Heart size and pulmonary vascularity are normal. No
adenopathy. No bone lesions. No pneumothorax.
IMPRESSION: No edema or consolidation.

## 2018-07-20 ENCOUNTER — Other Ambulatory Visit: Payer: Self-pay

## 2018-07-20 ENCOUNTER — Ambulatory Visit: Payer: Self-pay | Attending: Primary Care | Admitting: Primary Care

## 2018-07-20 NOTE — Progress Notes (Signed)
Medical Assistant used [employer] to contact patient.  Patient was not available, [employer] left patient a voicemail. Voicemail states to give a call back to [place] with [facility] at [phone].

## 2019-03-08 ENCOUNTER — Other Ambulatory Visit: Payer: Self-pay

## 2019-03-08 ENCOUNTER — Ambulatory Visit (INDEPENDENT_AMBULATORY_CARE_PROVIDER_SITE_OTHER): Payer: Self-pay | Admitting: Primary Care

## 2019-03-08 ENCOUNTER — Encounter (INDEPENDENT_AMBULATORY_CARE_PROVIDER_SITE_OTHER): Payer: Self-pay | Admitting: Primary Care

## 2019-03-08 VITALS — BP 160/109 | HR 87 | Temp 97.3°F | Ht 65.0 in | Wt 179.4 lb

## 2019-03-08 DIAGNOSIS — I10 Essential (primary) hypertension: Secondary | ICD-10-CM

## 2019-03-08 DIAGNOSIS — E663 Overweight: Secondary | ICD-10-CM

## 2019-03-08 DIAGNOSIS — Z6829 Body mass index (BMI) 29.0-29.9, adult: Secondary | ICD-10-CM

## 2019-03-08 DIAGNOSIS — Z23 Encounter for immunization: Secondary | ICD-10-CM

## 2019-03-08 MED ORDER — CLONIDINE HCL 0.1 MG PO TABS
0.1000 mg | ORAL_TABLET | Freq: Once | ORAL | Status: AC
  Administered 2019-03-08: 11:00:00 0.1 mg via ORAL

## 2019-03-08 MED ORDER — LISINOPRIL-HYDROCHLOROTHIAZIDE 20-25 MG PO TABS: 1.0000 | ORAL_TABLET | Freq: Every day | ORAL | 3 refills | Status: DC

## 2019-03-08 MED FILL — LISINOPRIL-HCTZ 20-25 MG TA: 20-25 | 30 days supply | Qty: 30 | Fill #0

## 2019-03-08 NOTE — Patient Instructions (Signed)
Cmo controlar su hipertensin Managing Your Hypertension La hipertensin se denomina usualmente presin arterial alta. Ocurre cuando la sangre presiona contra las paredes de las arterias con demasiada fuerza. Las arterias son los vasos sanguneos que transportan la sangre desde el corazn hacia todas las partes del cuerpo. La hipertensin hace que el corazn haga ms esfuerzo para Chiropodist y [employer] que las arterias se Teacher, music o Advertising account executive. La hipertensin no tratada o no controlada puede causar infarto de miocardio, accidente cerebrovascular, enfermedad renal y otros problemas. Qu son las Futures trader de presin arterial? Una lectura de la presin arterial consiste de un nmero ms alto sobre un nmero ms bajo. En condiciones ideales, la presin arterial debe estar por debajo de 120/80. El primer nmero ("superior") es la presin sistlica. Es la medida de la presin de las arterias cuando el corazn late. El segundo nmero ("inferior") es la presin diastlica. Es la medida de la presin en las arterias cuando el corazn se relaja. Qu significa mi lectura de presin arterial? La presin arterial se clasifica en cuatro etapas. Sobre la base de la lectura de su presin arterial, el mdico puede usar las siguientes etapas para determinar si necesita tratamiento y de qu tipo. La presin sistlica y la presin diastlica se miden en una unidad llamada mm Hg. Normal  Presin sistlica: por debajo de 409.  Presin diastlica: por debajo de 80. Elevada  Presin sistlica: 811-914.  Presin diastlica: por debajo de 80. Etapa 1 de hipertensin  Presin sistlica: 782-956.  Presin diastlica: 21-30. Etapa 2 de hipertensin  Presin sistlica: 865 o ms.  Presin diastlica: 90 o ms. Cules son los riesgos para la salud asociados con la hipertensin? Controlar la hipertensin es una responsabilidad importante. La hipertensin no controlada puede causar:  Infarto de  miocardio.  Accidente cerebrovascular.  Debilitamiento de los vasos sanguneos (aneurisma).  Insuficiencia cardaca.  Dao renal.  Dao ocular.  Sndrome metablico.  Problemas de memoria y concentracin. Qu cambios puedo hacer para controlar mi hipertensin? La hipertensin se puede controlar haciendo [employer] estilo de vida y, posiblemente, tomando medicamentos. Su mdico le ayudar a crear un plan para bajar la presin arterial al rango normal. Comida y bebida   Siga una dieta con alto contenido de fibras y [place], y con bajo contenido de sal (sodio), azcar agregada y Daphene Jaeger. Un ejemplo de plan alimenticio es la dieta DASH (Dietary Approaches to Stop Hypertension, Mtodos alimenticios para detener la hipertensin). Para alimentarse de esta manera: ? Coma mucha fruta y [place]. Trate de que la mitad del plato de cada comida sea de frutas y verduras. ? Coma cereales integrales, como pasta integral, arroz integral y pan integral. Llene aproximadamente un cuarto del plato con cereales integrales. ? Consuma productos lcteos con bajo contenido de grasa. ? Evite la ingesta de cortes de carne grasa, carne procesada o curada, y carne de ave con piel. Llene aproximadamente un cuarto del plato con protenas magras, como pescado, pollo sin piel, frijoles, huevos y tofu. ? Evite ingerir alimentos prehechos o procesados. En general, estos tienen mayor cantidad de sodio, azcar agregada y Wendee Copp.  Reduzca su ingesta diaria de sodio. La mayora de las personas que tienen hipertensin deben comer menos de 1500 mg de sodio por [employer].  Limite el consumo de alcohol a no ms de 1 medida por da si es mujer y no est Music therapist y a 2 medidas por da si es hombre. Una medida equivale a 12onzas de cerveza, Public house manager de vino  o 1onzas de bebidas alcohlicas de alta graduacin. Estilo de vida  Trabaje con su mdico para mantener un peso saludable o perder peso. Pregntele cual es su peso  recomendado.  Realice al menos 30 minutos de ejercicio que haga que se acelere su corazn (ejercicio aerbico) la mayora de los das de la semana. Estas actividades pueden incluir caminar, nadar o andar en bicicleta.  Incluya ejercicios para fortalecer sus msculos (ejercicios de resistencia), como levantamiento de pesas, como parte de su rutina semanal de ejercicios. Intente realizar 30minutos de este tipo de ejercicios al menos tres das a la semana.  No consuma ningn producto que contenga nicotina o tabaco, como cigarrillos y cigarrillos electrnicos. Si necesita ayuda para dejar de fumar, consulte al mdico.  Controle las enfermedades a largo plazo (crnicas), como el colesterol alto o la diabetes. Control  Contrlese la presin arterial en su casa segn las indicaciones del mdico. La presin arterial deseada puede variar en funcin de las enfermedades, la edad y otros factores personales.  Contrlese la presin arterial de manera regular, en la frecuencia indicada por su mdico. Trabaje con su mdico  Revise con su mdico todos los medicamentos que toma ya que puede haber efectos secundarios o interacciones.  Hable con su mdico acerca de la dieta, hbitos de ejercicio y otros factores del estilo de vida que pueden contribuir a la hipertensin.  Consulte a su mdico regularmente. Su mdico puede ayudarle a crear y ajustar su plan para controlar la hipertensin. Debo tomar un medicamento para controlar mi presin arterial? El mdico puede recetarle medicamentos si los cambios en el estilo de vida no son suficientes para lograr controlar la presin arterial y si:  Su presin arterial sistlica es de 130 o ms.  Su presin arterial diastlica es de 80 o ms. Tome los medicamentos solamente como se lo haya indicado el mdico. Siga cuidadosamente las indicaciones. Los medicamentos para la presin arterial deben tomarse segn las indicaciones. Los medicamentos pierden eficacia al  omitir las dosis. El hecho de omitir las dosis tambin aumenta el riesgo de otros problemas. Comunquese con un mdico si:  Piensa que tiene una reaccin alrgica a los medicamentos que ha tomado.  Tiene dolores de cabeza frecuentes (recurrentes).  Siente mareos.  Tiene hinchazn en los tobillos.  Tiene problemas de visin. Solicite ayuda de inmediato si:  Siente un dolor de cabeza intenso o confusin.  Siente debilidad inusual, adormecimiento o que se desmayar.  Siente un dolor intenso en el pecho o el abdomen.  Vomita repetidas veces.  Tiene dificultad para respirar. Resumen  La hipertensin se produce cuando la sangre bombea en las arterias con mucha fuerza. Si esta afeccin no se controla, podra correr riesgo de tener complicaciones graves.  La presin arterial deseada puede variar en funcin de las enfermedades, la edad y otros factores personales. Para la mayora de las personas, una presin arterial normal es menor que 120/80.  La hipertensin se puede controlar mediante cambios en el estilo de vida, tomando medicamentos, o ambas cosas. Los cambios en el estilo de vida incluyen prdida de peso, ingerir alimentos sanos, seguir una dieta baja en sodio, hacer ms ejercicio y limitar el consumo de alcohol. Esta informacin no tiene como fin reemplazar el consejo del mdico. Asegrese de hacerle al mdico cualquier pregunta que tenga. Document Released: 12/08/2011 Document Revised: 05/24/2016 Document Reviewed: 02/25/2016 Elsevier Patient Education  2020 Elsevier Inc.  

## 2019-03-08 NOTE — Progress Notes (Addendum)
Established Patient Office Visit  Subjective:  Patient ID: Patricia Brooks, female    DOB: 03/13/1981  Age: 38 y.o. MRN: [id number]  CC:  Chief Complaint  Patient presents with  . Establish Care    hypertension    HPI Patricia QuailYessenia Fackler presents for establish care with new PCP but established with [facility]. She presents today for management of blood pressure . Bp 197/115 she endorses having  shortness of breath, headaches, chest pain no lower extremity edema. She has been off Bp meds since 08/2017  Past Medical History:  Diagnosis Date  . Anxiety 2012   after last pregnancy  . Hypertension 2013   Previously on Lisinopril    Past Surgical History:  Procedure Laterality Date  . CESAREAN SECTION  2007, 2013  . TUBAL LIGATION  2013   [place]    Family History  Problem Relation Age of Onset  . Seizures Mother   . Kidney Stones Sister   . Blindness Son        Not clear if blind or just with poor vision  . Migraines Son   . Stroke Sister        Recurrent from valvular issue per patient    Social History   Socioeconomic History  . Marital status: Divorced    Spouse name: Not on file  . Number of children: 2  . Years of education: 12+  . Highest education level: Not on file  Occupational History  . Occupation: AcupuncturistCompare shelf stocker and meat dept.  Tobacco Use  . Smoking status: Never Smoker  . Smokeless tobacco: Never Used  Substance and Sexual Activity  . Alcohol use: No  . Drug use: No  . Sexual activity: Not Currently  Other Topics Concern  . Not on file  Social History Narrative   Originally from [place]   [place]   Lives at home with son and daughter.   [place]   Social Determinants of Health   Financial Resource Strain:   . Difficulty of Paying Living Expenses: Not on file  Food Insecurity:   . Worried About Programme researcher, broadcasting/film/videounning Out of Food in the Last Year: Not on file  . Ran Out of Food in the Last Year: Not on file   Transportation Needs:   . Lack of Transportation (Medical): Not on file  . Lack of Transportation (Non-Medical): Not on file  Physical Activity:   . Days of Exercise per Week: Not on file  . Minutes of Exercise per Session: Not on file  Stress:   . Feeling of Stress : Not on file  Social Connections:   . Frequency of Communication with Friends and Family: Not on file  . Frequency of Social Gatherings with Friends and Family: Not on file  . Attends Religious Services: Not on file  . Active Member of Clubs or Organizations: Not on file  . Attends BankerClub or Organization Meetings: Not on file  . Marital Status: Not on file  Intimate Partner Violence:   . Fear of Current or Ex-Partner: Not on file  . Emotionally Abused: Not on file  . Physically Abused: Not on file  . Sexually Abused: Not on file    Outpatient Medications Prior to Visit  Medication Sig Dispense Refill  . clonazePAM (KLONOPIN) 0.25 MG disintegrating tablet Take 1 tablet (0.25 mg total) by mouth 2 (two) times daily. (Patient not taking: Reported on 03/08/2019) 30 tablet 0  . escitalopram (LEXAPRO)  10 MG tablet Take 1 tablet (10 mg total) by mouth daily. (Patient not taking: Reported on 03/08/2019) 30 tablet 0  . ferrous sulfate 325 (65 FE) MG EC tablet Take 1 tablet (325 mg total) by mouth 3 (three) times daily with meals. (Patient not taking: Reported on 03/08/2019) 90 tablet 0  . doxycycline (VIBRAMYCIN) 100 MG capsule Take 1 capsule (100 mg total) by mouth 2 (two) times daily. (Patient not taking: Reported on 09/02/2017) 20 capsule 0  . lisinopril-hydrochlorothiazide (PRINZIDE,ZESTORETIC) 10-12.5 MG tablet Take 1 tablet by mouth daily. (Patient not taking: Reported on 03/08/2019) 90 tablet 3   No facility-administered medications prior to visit.    Allergies  Allergen Reactions  . Aspirin Swelling and Rash    ROS Review of Systems  Eyes: Positive for visual disturbance.  Respiratory: Positive for shortness of  breath.   Cardiovascular: Positive for chest pain.  Neurological: Positive for syncope and headaches.  All other systems reviewed and are negative.     Objective:    Physical Exam  Constitutional: She is oriented to person, place, and time. She appears well-developed and well-nourished.  HENT:  Head: Normocephalic.  Eyes: Pupils are equal, round, and reactive to light. EOM are normal.  Cardiovascular: Normal rate and regular rhythm.  Pulmonary/Chest: Effort normal and breath sounds normal.  Abdominal: Soft. Bowel sounds are normal.  Musculoskeletal:        General: Normal range of motion.     Cervical back: Normal range of motion and neck supple.  Neurological: She is oriented to person, place, and time.  Skin: Skin is warm and dry.  Psychiatric: She has a normal mood and affect. Her behavior is normal. Judgment and thought content normal.    BP (!) 160/109 (BP Location: Right Arm, Patient Position: Sitting, Cuff Size: Normal)   Pulse 87   Temp (!) 97.3 F (36.3 C) (Temporal)   Ht 5\' 5"  (1.651 m)   Wt 179 lb 6.4 oz (81.4 kg)   LMP 03/01/2019 (Approximate)   SpO2 99%   BMI 29.85 kg/m  Wt Readings from Last 3 Encounters:  03/08/19 179 lb 6.4 oz (81.4 kg)  06/16/17 163 lb 12.8 oz (74.3 kg)  03/17/17 167 lb 12.8 oz (76.1 kg)     Health Maintenance Due  Topic Date Due  . PAP SMEAR-Modifier  05/25/2019    There are no preventive care reminders to display for this patient.  Lab Results  Component Value Date   TSH 1.670 03/17/2017   Lab Results  Component Value Date   WBC 3.5 03/08/2019   HGB 11.8 03/08/2019   HCT 36.6 03/08/2019   MCV 82 03/08/2019   PLT 273 03/08/2019   Lab Results  Component Value Date   NA 143 03/08/2019   K 3.9 03/08/2019   CO2 26 03/08/2019   GLUCOSE 95 03/08/2019   BUN 11 03/08/2019   CREATININE 0.68 03/08/2019   BILITOT 0.2 03/08/2019   ALKPHOS 80 03/08/2019   AST 14 03/08/2019   ALT 15 03/08/2019   PROT 7.6 03/08/2019    ALBUMIN 4.4 03/08/2019   CALCIUM 9.3 03/08/2019   ANIONGAP 10 09/02/2017   Lab Results  Component Value Date   CHOL 175 03/08/2019   Lab Results  Component Value Date   HDL 69 03/08/2019   Lab Results  Component Value Date   LDLCALC 93 03/08/2019   Lab Results  Component Value Date   TRIG 71 03/08/2019   Lab Results  Component Value Date  CHOLHDL 2.5 03/08/2019   No results found for: HGBA1C    Assessment & Plan:  Emilene was seen today for establish care.  Diagnoses and all orders for this visit:  Need for Tdap vaccination -     Tdap vaccine greater than or equal to 7yo IM  Hypertension, unspecified type Counseled on blood pressure goal of less than 130/80, low-sodium, medication compliance, 150 minutes of moderate intensity exercise per week. In office treated with  -     cloNIDine (CATAPRES) tablet 0.1 mg. In office. Prescribed lisinopril 20/HCTZ 25 take daily   Body mass index (BMI) 29.0-29.9, adult/Overweight (BMI 25.0-29.9) Discussed diet and exercise for person with BMI >25. Instructed: You must burn more calories than you eat. Losing 5 percent of your body weight should be considered a success. In the longer term, losing more than 15 percent of your body weight and staying at this weight is an extremely good result. However, keep in mind that even losing 5 percent of your body weight leads to important health benefits, so try not to get discouraged if you're not able to lose more than this. Will recheck weight in 3-6 months.   Meds ordered this encounter  Medications  . cloNIDine (CATAPRES) tablet 0.1 mg  . lisinopril-hydrochlorothiazide (ZESTORETIC) 20-25 MG tablet    Sig: Take 1 tablet by mouth daily.    Dispense:  90 tablet    Refill:  3    Follow-up: Return for 6-8 weeks Bp follow up tele.    Grayce Sessions, NP

## 2019-03-09 ENCOUNTER — Telehealth (INDEPENDENT_AMBULATORY_CARE_PROVIDER_SITE_OTHER): Payer: Self-pay

## 2019-03-09 LAB — CBC WITH DIFFERENTIAL/PLATELET
Basophils Absolute: 0 10*3/uL (ref 0.0–0.2)
Basos: 1 %
EOS (ABSOLUTE): 0.1 10*3/uL (ref 0.0–0.4)
Eos: 3 %
Hematocrit: 36.6 % (ref 34.0–46.6)
Hemoglobin: 11.8 g/dL (ref 11.1–15.9)
Immature Grans (Abs): 0 10*3/uL (ref 0.0–0.1)
Immature Granulocytes: 0 %
Lymphocytes Absolute: 1.2 10*3/uL (ref 0.7–3.1)
Lymphs: 34 %
MCH: 26.4 pg — ABNORMAL LOW (ref 26.6–33.0)
MCHC: 32.2 g/dL (ref 31.5–35.7)
MCV: 82 fL (ref 79–97)
Monocytes Absolute: 0.4 10*3/uL (ref 0.1–0.9)
Monocytes: 13 %
Neutrophils Absolute: 1.7 10*3/uL (ref 1.4–7.0)
Neutrophils: 49 %
Platelets: 273 10*3/uL (ref 150–450)
RBC: 4.47 x10E6/uL (ref 3.77–5.28)
RDW: 13.7 % (ref 11.7–15.4)
WBC: 3.5 10*3/uL (ref 3.4–10.8)

## 2019-03-09 LAB — CMP14+EGFR
ALT: 15 IU/L (ref 0–32)
AST: 14 IU/L (ref 0–40)
Albumin/Globulin Ratio: 1.4 (ref 1.2–2.2)
Albumin: 4.4 g/dL (ref 3.8–4.8)
Alkaline Phosphatase: 80 IU/L (ref 39–117)
BUN/Creatinine Ratio: 16 (ref 9–23)
BUN: 11 mg/dL (ref 6–20)
Bilirubin Total: 0.2 mg/dL (ref 0.0–1.2)
CO2: 26 mmol/L (ref 20–29)
Calcium: 9.3 mg/dL (ref 8.7–10.2)
Chloride: 104 mmol/L (ref 96–106)
Creatinine, Ser: 0.68 mg/dL (ref 0.57–1.00)
GFR calc Af Amer: 128 mL/min/{1.73_m2} (ref >59)
GFR calc non Af Amer: 111 mL/min/{1.73_m2} (ref >59)
Globulin, Total: 3.2 g/dL (ref 1.5–4.5)
Glucose: 95 mg/dL (ref 65–99)
Potassium: 3.9 mmol/L (ref 3.5–5.2)
Sodium: 143 mmol/L (ref 134–144)
Total Protein: 7.6 g/dL (ref 6.0–8.5)

## 2019-03-09 LAB — LIPID PANEL
Chol/HDL Ratio: 2.5 ratio (ref 0.0–4.4)
Cholesterol, Total: 175 mg/dL (ref 100–199)
HDL: 69 mg/dL (ref >39)
LDL Chol Calc (NIH): 93 mg/dL (ref 0–99)
Triglycerides: 71 mg/dL (ref 0–149)
VLDL Cholesterol Cal: 13 mg/dL (ref 5–40)

## 2019-03-09 NOTE — Telephone Encounter (Signed)
-----   Message from Kerin Perna, NP sent at 03/09/2019  9:08 AM EST ----- I have reviewed all labs and they are normal

## 2019-03-09 NOTE — Telephone Encounter (Signed)
Call placed using pacific interpreter [phone]) left voicemail informing patient that labs are normal. Return call to [facility] at [phone] with any questions or concerns. Nat Christen, CMA

## 2019-05-03 ENCOUNTER — Ambulatory Visit (INDEPENDENT_AMBULATORY_CARE_PROVIDER_SITE_OTHER): Payer: Self-pay | Admitting: Primary Care

## 2020-01-17 ENCOUNTER — Other Ambulatory Visit: Payer: Self-pay

## 2020-01-17 ENCOUNTER — Encounter (INDEPENDENT_AMBULATORY_CARE_PROVIDER_SITE_OTHER): Payer: Self-pay | Admitting: Primary Care

## 2020-01-17 ENCOUNTER — Ambulatory Visit (INDEPENDENT_AMBULATORY_CARE_PROVIDER_SITE_OTHER): Payer: Self-pay | Admitting: Primary Care

## 2020-01-17 ENCOUNTER — Other Ambulatory Visit (INDEPENDENT_AMBULATORY_CARE_PROVIDER_SITE_OTHER): Payer: Self-pay | Admitting: Primary Care

## 2020-01-17 VITALS — BP 156/104 | HR 73 | Temp 97.3°F | Ht 65.0 in | Wt 178.2 lb

## 2020-01-17 DIAGNOSIS — I10 Essential (primary) hypertension: Secondary | ICD-10-CM

## 2020-01-17 DIAGNOSIS — Z23 Encounter for immunization: Secondary | ICD-10-CM

## 2020-01-17 DIAGNOSIS — N921 Excessive and frequent menstruation with irregular cycle: Secondary | ICD-10-CM

## 2020-01-17 DIAGNOSIS — Z13220 Encounter for screening for lipoid disorders: Secondary | ICD-10-CM

## 2020-01-17 MED ORDER — AMLODIPINE BESYLATE 10 MG PO TABS: 10.0000 mg | ORAL_TABLET | Freq: Every day | ORAL | 1 refills | Status: DC

## 2020-01-17 MED ORDER — LISINOPRIL-HYDROCHLOROTHIAZIDE 20-25 MG PO TABS: 1.0000 | ORAL_TABLET | Freq: Every day | ORAL | 1 refills | Status: DC

## 2020-01-17 MED FILL — LISINOPRIL-HYDROCHLOROTHIAZ: 20-25 | 30 days supply | Qty: 30 | Fill #0

## 2020-01-17 MED FILL — AMLODIPINE BESYLATE 10 MG T: 10 | 30 days supply | Qty: 30 | Fill #0

## 2020-01-17 NOTE — Patient Instructions (Signed)
Hipertensión en los adultos °Hypertension, Adult °La presión arterial alta (hipertensión) se produce cuando la fuerza de la sangre bombea a través de las arterias con mucha fuerza. Las arterias son los vasos sanguíneos que transportan la sangre desde el corazón al resto del cuerpo. La hipertensión hace que el corazón haga más esfuerzo para bombear sangre y puede provocar que las arterias se estrechen o endurezcan. La hipertensión no tratada o no controlada puede causar infarto de miocardio, insuficiencia cardíaca, accidente cerebrovascular, enfermedad renal y otros problemas. °Una lectura de la presión arterial consta de un número más alto sobre un número más bajo. En condiciones ideales, la presión arterial debe estar por debajo de 120/80. El primer número (“superior”) es la presión sistólica. Es la medida de la presión de las arterias cuando el corazón late. El segundo número (“inferior”) es la presión diastólica. Es la medida de la presión en las arterias cuando el corazón se relaja. °¿Cuáles son las causas? °Se desconoce la causa exacta de esta afección. Hay algunas afecciones que causan presión arterial alta o están relacionadas con ella. °¿Qué incrementa el riesgo? °Algunos factores de riesgo de hipertensión están bajo su control. Los siguientes factores pueden hacer que sea más propenso a desarrollar esta afección: °· Fumar. °· Tener diabetes mellitus tipo 2, colesterol alto, o ambos. °· No hacer la cantidad suficiente de actividad física o ejercicio. °· Tener sobrepeso. °· Consumir mucha grasa, azúcar, calorías o sal (sodio) en su dieta. °· Beber alcohol en exceso. °Algunos factores de riesgo para la presión arterial alta pueden ser difíciles o imposibles de cambiar. Algunos de estos factores son los siguientes: °· Tener enfermedad renal crónica. °· Tener antecedentes familiares de presión arterial alta. °· Edad. Los riesgos aumentan con la edad. °· Raza. El riesgo es mayor para las personas  afroamericanas. °· Sexo. Antes de los 45 años, los hombres corren más riesgo que las mujeres. Después de los 65 años, las mujeres corren más riesgo que los hombres. °· Tener apnea obstructiva del sueño. °· Estrés. °¿Cuáles son los signos o los síntomas? °Es posible que la presión arterial alta puede no cause síntomas. La presión arterial muy alta (crisis hipertensiva) puede provocar: °· Dolor de cabeza. °· Ansiedad. °· Falta de aire. °· Hemorragia nasal. °· Náuseas y vómitos. °· Cambios en la visión. °· Dolor de pecho intenso. °· Convulsiones. °¿Cómo se diagnostica? °Esta afección se diagnostica al medir su presión arterial mientras se encuentra sentado, con el brazo apoyado sobre una superficie plana, las piernas sin cruzar y los pies bien apoyados en el piso. El brazalete del tensiómetro debe colocarse directamente sobre la piel de la parte superior del brazo y al nivel de su corazón. Debe medirla al menos dos veces en el mismo brazo. Determinadas condiciones pueden causar una diferencia de presión arterial entre el brazo izquierdo y el derecho. °Ciertos factores pueden provocar que las lecturas de la presión arterial sean inferiores o superiores a lo normal por un período corto de tiempo: °· Si su presión arterial es más alta cuando se encuentra en el consultorio del médico que cuando la mide en su hogar, se denomina “hipertensión de bata blanca”. La mayoría de las personas que tienen esta afección no deben ser medicadas. °· Si su presión arterial es más alta en el hogar que cuando se encuentra en el consultorio del médico, se denomina “hipertensión enmascarada”. La mayoría de las personas que tienen esta afección deben ser medicadas para controlar la presión arterial. °Si tiene una lecturas de presión arterial alta durante   una visita o si tiene presión arterial normal con otros factores de riesgo, se le podrá pedir que haga lo siguiente: °· Que regrese otro día para volver a controlar su presión arterial  nuevamente. °· Que se controle la presión arterial en su casa durante 1 semana o más. °Si se le diagnostica hipertensión, es posible que se le realicen otros análisis de sangre o estudios de diagnóstico por imágenes para ayudar a su médico a comprender su riesgo general de tener otras afecciones. °¿Cómo se trata? °Esta afección se trata haciendo cambios saludables en el estilo de vida, tales como ingerir alimentos saludables, realizar más ejercicio y reducir el consumo de alcohol. El médico puede recetarle medicamentos si los cambios en el estilo de vida no son suficientes para lograr controlar la presión arterial y si: °· Su presión arterial sistólica está por encima de 130. °· Su presión arterial diastólica está por encima de 80. °La presión arterial deseada puede variar en función de las enfermedades, la edad y otros factores personales. °Siga estas instrucciones en su casa: °Comida y bebida ° °· Siga una dieta con alto contenido de fibras y potasio, y con bajo contenido de sodio, azúcar agregada y grasas. Un ejemplo de plan alimenticio es la dieta DASH (Dietary Approaches to Stop Hypertension, Métodos alimenticios para detener la hipertensión). Para alimentarse de esta manera: °? Coma mucha fruta y verdura fresca. Trate de que la mitad del plato de cada comida sea de frutas y verduras. °? Coma cereales integrales, como pasta integral, arroz integral o pan integral. Llene aproximadamente un cuarto del plato con cereales integrales. °? Coma y beba productos lácteos con bajo contenido de grasa, como leche descremada o yogur bajo en grasas. °? Evite la ingesta de cortes de carne grasa, carne procesada o curada, y carne de ave con piel. Llene aproximadamente un cuarto del plato con proteínas magras, como pescado, pollo sin piel, frijoles, huevos o tofu. °? Evite ingerir alimentos prehechos y procesados. En general, estos tienen mayor cantidad de sodio, azúcar agregada y grasa. °· Reduzca su ingesta diaria de sodio.  La mayoría de las personas que tienen hipertensión deben comer menos de 1500 mg de sodio por día. °· No beba alcohol si: °? Su médico le indica no hacerlo. °? Está embarazada, puede estar embarazada o está tratando de quedar embarazada. °· Si bebe alcohol: °? Limite la cantidad que bebe a lo siguiente: °§ De 0 a 1 medida por día para las mujeres. °§ De 0 a 2 medidas por día para los hombres. °? Esté atento a la cantidad de alcohol que hay en las bebidas que toma. En los [place], una medida equivale a una botella de cerveza de 12 oz (355 ml), un vaso de vino de 5 oz (148 ml) o un vaso de una bebida alcohólica de alta graduación de 1½ oz (44 ml). °Estilo de vida ° °· Trabaje con su médico para mantener un peso saludable o perder peso. Pregúntele cuál es el peso recomendado para usted. °· Haga al menos 30 minutos de ejercicio la mayoría de los días de la semana. Estas actividades pueden incluir caminar, nadar o andar en bicicleta. °· Incluya ejercicios para fortalecer sus músculos (ejercicios de resistencia), como [employer] o levantamiento de pesas, como parte de su rutina semanal de ejercicios. Intente realizar 30 minutos de este tipo de ejercicios al menos tres días a la semana. °· No consuma ningún producto que contenga nicotina o tabaco, como cigarrillos, cigarrillos electrónicos y tabaco de mascar. Si necesita ayuda para dejar de fumar, consulte al   médico. °· Contrólese la presión arterial en su casa según las indicaciones del médico. °· Concurra a todas las visitas de seguimiento como se lo haya indicado el médico. Esto es importante. °Medicamentos °· Tome los medicamentos de venta libre y los recetados solamente como se lo haya indicado el médico. Siga cuidadosamente las indicaciones. Los medicamentos para la presión arterial deben tomarse según las indicaciones. °· No omita las dosis de medicamentos para la presión arterial. Si lo hace, estará en riesgo de tener problemas y puede hacer que los medicamentos  sean menos eficaces. °· Pregúntele a su médico a qué efectos secundarios o reacciones a los medicamentos debe prestar atención. °Comuníquese con un médico si: °· Piensa que tiene una reacción a un medicamento que está tomando. °· Tiene dolores de cabeza frecuentes (recurrentes). °· Se siente mareado. °· Tiene hinchazón en los tobillos. °· Tiene problemas de visión. °Solicite ayuda inmediatamente si: °· Siente un dolor de cabeza intenso o confusión. °· Siente debilidad inusual o adormecimiento. °· Siente que va a desmayarse. °· Siente un dolor intenso en el pecho o el abdomen. °· Vomita repetidas veces. °· Tiene dificultad para respirar. °Resumen °· La hipertensión se produce cuando la sangre bombea en las arterias con mucha fuerza. Si esta afección no se controla, podría correr riesgo de tener complicaciones graves. °· La presión arterial deseada puede variar en función de las enfermedades, la edad y otros factores personales. Para la mayoría de las personas, una presión arterial normal es menor que 120/80. °· La hipertensión se trata con cambios en el estilo de vida, medicamentos o una combinación de ambos. Los cambios en el estilo de vida incluyen pérdida de peso, ingerir alimentos sanos, seguir una dieta baja en sodio, hacer más ejercicio y limitar el consumo de alcohol. °Esta información no tiene como fin reemplazar el consejo del médico. Asegúrese de hacerle al médico cualquier pregunta que tenga. °Document Revised: 12/29/2017 Document Reviewed: 12/29/2017 °Elsevier Patient Education © 2020 [employer] ° °

## 2020-01-17 NOTE — Progress Notes (Signed)
Established Patient Office Visit  Subjective:  Patient ID: Patricia Brooks, female    DOB: October 29, 1980  Age: 39 y.o. MRN: [id number]  CC:  Chief Complaint  Patient presents with  . Blood Pressure Check    HPI Patricia Brooks presents for hypertension management.  She denies shortness of breath, headaches, chest pain or lower extremity edema  Past Medical History:  Diagnosis Date  . Anxiety 2012   after last pregnancy  . Hypertension 2013   Previously on Lisinopril    Past Surgical History:  Procedure Laterality Date  . CESAREAN SECTION  2007, 2013  . TUBAL LIGATION  2013   [place]    Family History  Problem Relation Age of Onset  . Seizures Mother   . Kidney Stones Sister   . Blindness Son        Not clear if blind or just with poor vision  . Migraines Son   . Stroke Sister        Recurrent from valvular issue per patient    Social History   Socioeconomic History  . Marital status: Divorced    Spouse name: Not on file  . Number of children: 2  . Years of education: 12+  . Highest education level: Not on file  Occupational History  . Occupation: Scientist, research (medical).  Tobacco Use  . Smoking status: Never Smoker  . Smokeless tobacco: Never Used  Substance and Sexual Activity  . Alcohol use: No  . Drug use: No  . Sexual activity: Not Currently  Other Topics Concern  . Not on file  Social History Narrative   Originally from [place]   Came to [place]. In 1994   Lives at home with son and daughter.   Moved from [place] to [facility] June of 2017   Social Determinants of Health   Financial Resource Strain:   . Difficulty of Paying Living Expenses: Not on file  Food Insecurity:   . Worried About Charity fundraiser in the Last Year: Not on file  . Ran Out of Food in the Last Year: Not on file  Transportation Needs:   . Lack of Transportation (Medical): Not on file  . Lack of Transportation (Non-Medical): Not on file  Physical Activity:    . Days of Exercise per Week: Not on file  . Minutes of Exercise per Session: Not on file  Stress:   . Feeling of Stress : Not on file  Social Connections:   . Frequency of Communication with Friends and Family: Not on file  . Frequency of Social Gatherings with Friends and Family: Not on file  . Attends Religious Services: Not on file  . Active Member of Clubs or Organizations: Not on file  . Attends Archivist Meetings: Not on file  . Marital Status: Not on file  Intimate Partner Violence:   . Fear of Current or Ex-Partner: Not on file  . Emotionally Abused: Not on file  . Physically Abused: Not on file  . Sexually Abused: Not on file    Outpatient Medications Prior to Visit  Medication Sig Dispense Refill  . clonazePAM (KLONOPIN) 0.25 MG disintegrating tablet Take 1 tablet (0.25 mg total) by mouth 2 (two) times daily. (Patient not taking: Reported on 03/08/2019) 30 tablet 0  . escitalopram (LEXAPRO) 10 MG tablet Take 1 tablet (10 mg total) by mouth daily. (Patient not taking: Reported on 03/08/2019) 30 tablet 0  . ferrous sulfate 325 (65 FE) MG  EC tablet Take 1 tablet (325 mg total) by mouth 3 (three) times daily with meals. (Patient not taking: Reported on 03/08/2019) 90 tablet 0  . lisinopril-hydrochlorothiazide (ZESTORETIC) 20-25 MG tablet Take 1 tablet by mouth daily. (Patient not taking: Reported on 01/17/2020) 90 tablet 3   No facility-administered medications prior to visit.    Allergies  Allergen Reactions  . Aspirin Swelling and Rash    ROS Review of Systems  Neurological: Positive for dizziness and light-headedness.  Psychiatric/Behavioral: Positive for agitation.  All other systems reviewed and are negative.     Objective:    Physical Exam Vitals reviewed.  Constitutional:      Appearance: Normal appearance.  HENT:     Head: Normocephalic.     Right Ear: Tympanic membrane normal.     Left Ear: Tympanic membrane normal.     Nose: Nose normal.   Eyes:     Extraocular Movements: Extraocular movements intact.     Pupils: Pupils are equal, round, and reactive to light.  Cardiovascular:     Rate and Rhythm: Normal rate and regular rhythm.  Pulmonary:     Effort: Pulmonary effort is normal.     Breath sounds: Normal breath sounds.  Abdominal:     General: Bowel sounds are normal.     Palpations: Abdomen is soft.  Musculoskeletal:        General: Normal range of motion.     Cervical back: Normal range of motion and neck supple.  Skin:    General: Skin is warm and dry.  Neurological:     Mental Status: She is alert and oriented to person, place, and time.  Psychiatric:        Mood and Affect: Mood normal.        Behavior: Behavior normal.        Thought Content: Thought content normal.        Judgment: Judgment normal.     BP (!) 156/104 (BP Location: Right Arm, Patient Position: Sitting, Cuff Size: Normal)   Pulse 73   Temp (!) 97.3 F (36.3 C) (Temporal)   Ht 5' 5" (1.651 m)   Wt 178 lb 3.2 oz (80.8 kg)   LMP 12/28/2019 (Approximate)   SpO2 100%   BMI 29.65 kg/m  Wt Readings from Last 3 Encounters:  01/17/20 178 lb 3.2 oz (80.8 kg)  03/08/19 179 lb 6.4 oz (81.4 kg)  06/16/17 163 lb 12.8 oz (74.3 kg)     Health Maintenance Due  Topic Date Due  . Hepatitis C Screening  Never done  . PAP SMEAR-Modifier  05/25/2019    There are no preventive care reminders to display for this patient.  Lab Results  Component Value Date   TSH 1.670 03/17/2017   Lab Results  Component Value Date   WBC 3.8 01/17/2020   HGB 10.9 (L) 01/17/2020   HCT 35.9 01/17/2020   MCV 82 01/17/2020   PLT 325 01/17/2020   Lab Results  Component Value Date   NA 140 01/17/2020   K 3.6 01/17/2020   CO2 25 01/17/2020   GLUCOSE 85 01/17/2020   BUN 9 01/17/2020   CREATININE 0.70 01/17/2020   BILITOT 0.3 01/17/2020   ALKPHOS 78 01/17/2020   AST 15 01/17/2020   ALT 12 01/17/2020   PROT 7.8 01/17/2020   ALBUMIN 4.4 01/17/2020    CALCIUM 9.1 01/17/2020   ANIONGAP 10 09/02/2017   Lab Results  Component Value Date   CHOL 173 01/17/2020  Lab Results  Component Value Date   HDL 66 01/17/2020   Lab Results  Component Value Date   LDLCALC 92 01/17/2020   Lab Results  Component Value Date   TRIG 80 01/17/2020   Lab Results  Component Value Date   CHOLHDL 2.6 01/17/2020   No results found for: HGBA1C    Assessment & Plan:  Patricia Brooks was seen today for blood pressure check.  Diagnoses and all orders for this visit:  Hypertension, unspecified type Counseled on blood pressure goal of less than 130/80, low-sodium, DASH diet, medication compliance, 150 minutes of moderate intensity exercise per week. Discussed medication compliance, adverse effects. -     lisinopril-hydrochlorothiazide (ZESTORETIC) 20-25 MG tablet; Take 1 tablet by mouth daily. -     amLODipine (NORVASC) 10 MG tablet; Take 1 tablet (10 mg total) by mouth daily. -     CMP14+EGFR  Menorrhagia with irregular cycle -     CBC with Differential  Lipid screening -     Lipid Panel  Need for immunization against influenza -     Flu Vaccine QUAD 36+ mos IM   Meds ordered this encounter  Medications  . lisinopril-hydrochlorothiazide (ZESTORETIC) 20-25 MG tablet    Sig: Take 1 tablet by mouth daily.    Dispense:  90 tablet    Refill:  1  . amLODipine (NORVASC) 10 MG tablet    Sig: Take 1 tablet (10 mg total) by mouth daily.    Dispense:  90 tablet    Refill:  1    Follow-up: Return in about 5 weeks (around 02/21/2020) for Bp check .    Kerin Perna, NP

## 2020-01-18 LAB — LIPID PANEL
Chol/HDL Ratio: 2.6 ratio (ref 0.0–4.4)
Cholesterol, Total: 173 mg/dL (ref 100–199)
HDL: 66 mg/dL (ref >39)
LDL Chol Calc (NIH): 92 mg/dL (ref 0–99)
Triglycerides: 80 mg/dL (ref 0–149)
VLDL Cholesterol Cal: 15 mg/dL (ref 5–40)

## 2020-01-18 LAB — CBC WITH DIFFERENTIAL/PLATELET
Basophils Absolute: 0 10*3/uL (ref 0.0–0.2)
Basos: 1 %
EOS (ABSOLUTE): 0.2 10*3/uL (ref 0.0–0.4)
Eos: 5 %
Hematocrit: 35.9 % (ref 34.0–46.6)
Hemoglobin: 10.9 g/dL — ABNORMAL LOW (ref 11.1–15.9)
Immature Grans (Abs): 0 10*3/uL (ref 0.0–0.1)
Immature Granulocytes: 0 %
Lymphocytes Absolute: 1.3 10*3/uL (ref 0.7–3.1)
Lymphs: 35 %
MCH: 24.8 pg — ABNORMAL LOW (ref 26.6–33.0)
MCHC: 30.4 g/dL — ABNORMAL LOW (ref 31.5–35.7)
MCV: 82 fL (ref 79–97)
Monocytes Absolute: 0.3 10*3/uL (ref 0.1–0.9)
Monocytes: 9 %
Neutrophils Absolute: 1.9 10*3/uL (ref 1.4–7.0)
Neutrophils: 50 %
Platelets: 325 10*3/uL (ref 150–450)
RBC: 4.39 x10E6/uL (ref 3.77–5.28)
RDW: 14.7 % (ref 11.7–15.4)
WBC: 3.8 10*3/uL (ref 3.4–10.8)

## 2020-01-18 LAB — CMP14+EGFR
ALT: 12 IU/L (ref 0–32)
AST: 15 IU/L (ref 0–40)
Albumin/Globulin Ratio: 1.3 (ref 1.2–2.2)
Albumin: 4.4 g/dL (ref 3.8–4.8)
Alkaline Phosphatase: 78 IU/L (ref 44–121)
BUN/Creatinine Ratio: 13 (ref 9–23)
BUN: 9 mg/dL (ref 6–20)
Bilirubin Total: 0.3 mg/dL (ref 0.0–1.2)
CO2: 25 mmol/L (ref 20–29)
Calcium: 9.1 mg/dL (ref 8.7–10.2)
Chloride: 102 mmol/L (ref 96–106)
Creatinine, Ser: 0.7 mg/dL (ref 0.57–1.00)
GFR calc Af Amer: 127 mL/min/{1.73_m2} (ref >59)
GFR calc non Af Amer: 110 mL/min/{1.73_m2} (ref >59)
Globulin, Total: 3.4 g/dL (ref 1.5–4.5)
Glucose: 85 mg/dL (ref 65–99)
Potassium: 3.6 mmol/L (ref 3.5–5.2)
Sodium: 140 mmol/L (ref 134–144)
Total Protein: 7.8 g/dL (ref 6.0–8.5)

## 2020-02-28 ENCOUNTER — Ambulatory Visit (INDEPENDENT_AMBULATORY_CARE_PROVIDER_SITE_OTHER): Payer: Self-pay | Admitting: Primary Care

## 2020-04-24 MED FILL — LISINOPRIL-HYDROCHLOROTHIAZ: 20-25 | 30 days supply | Qty: 30 | Fill #1

## 2020-04-24 MED FILL — AMLODIPINE BESYLATE 10 MG T: 10 | 30 days supply | Qty: 30 | Fill #1

## 2020-08-28 ENCOUNTER — Other Ambulatory Visit: Payer: Self-pay

## 2020-08-28 MED FILL — Lisinopril & Hydrochlorothiazide Tab 20-25 MG: ORAL | 90 days supply | Qty: 90 | Fill #0 | Status: AC

## 2021-06-01 ENCOUNTER — Other Ambulatory Visit (INDEPENDENT_AMBULATORY_CARE_PROVIDER_SITE_OTHER): Payer: Self-pay | Admitting: Primary Care

## 2021-06-01 ENCOUNTER — Other Ambulatory Visit: Payer: Self-pay

## 2021-06-01 DIAGNOSIS — I10 Essential (primary) hypertension: Secondary | ICD-10-CM

## 2021-06-18 ENCOUNTER — Ambulatory Visit: Payer: Self-pay | Admitting: Critical Care Medicine

## 2021-06-22 ENCOUNTER — Ambulatory Visit: Payer: Self-pay | Admitting: Nurse Practitioner

## 2021-06-22 ENCOUNTER — Other Ambulatory Visit: Payer: Self-pay

## 2021-07-13 ENCOUNTER — Encounter: Payer: Self-pay | Admitting: Nurse Practitioner

## 2021-07-13 ENCOUNTER — Ambulatory Visit: Payer: 59 | Attending: Nurse Practitioner | Admitting: Nurse Practitioner

## 2021-07-13 ENCOUNTER — Telehealth: Payer: Self-pay | Admitting: Nurse Practitioner

## 2021-07-13 ENCOUNTER — Other Ambulatory Visit: Payer: Self-pay

## 2021-07-13 VITALS — BP 157/89 | HR 86 | Temp 98.7°F | Resp 18 | Ht 65.0 in | Wt 173.6 lb

## 2021-07-13 DIAGNOSIS — E785 Hyperlipidemia, unspecified: Secondary | ICD-10-CM | POA: Diagnosis not present

## 2021-07-13 DIAGNOSIS — J8410 Pulmonary fibrosis, unspecified: Secondary | ICD-10-CM

## 2021-07-13 DIAGNOSIS — R1900 Intra-abdominal and pelvic swelling, mass and lump, unspecified site: Secondary | ICD-10-CM | POA: Diagnosis not present

## 2021-07-13 DIAGNOSIS — I10 Essential (primary) hypertension: Secondary | ICD-10-CM

## 2021-07-13 DIAGNOSIS — Z808 Family history of malignant neoplasm of other organs or systems: Secondary | ICD-10-CM

## 2021-07-13 DIAGNOSIS — Z862 Personal history of diseases of the blood and blood-forming organs and certain disorders involving the immune mechanism: Secondary | ICD-10-CM

## 2021-07-13 DIAGNOSIS — Z1231 Encounter for screening mammogram for malignant neoplasm of breast: Secondary | ICD-10-CM

## 2021-07-13 DIAGNOSIS — E876 Hypokalemia: Secondary | ICD-10-CM | POA: Diagnosis not present

## 2021-07-13 DIAGNOSIS — Z7689 Persons encountering health services in other specified circumstances: Secondary | ICD-10-CM

## 2021-07-13 DIAGNOSIS — Z1159 Encounter for screening for other viral diseases: Secondary | ICD-10-CM

## 2021-07-13 MED ORDER — VALSARTAN 40 MG PO TABS
40.0000 mg | ORAL_TABLET | Freq: Every day | ORAL | 0 refills | Status: DC
  Filled 2021-07-13 (×2): qty 30, 30d supply, fill #0

## 2021-07-13 NOTE — Telephone Encounter (Signed)
Copied from CRM [id number]. Topic: General - Other ?>> Jul 13, 2021  4:14 PM McGill, Darlina Rumpf wrote: ?Reason for CRM: Joni Reining from [facility] stated referral request is dated for October 17th and wants to clarify whether this is correct to schedule pt until after this date or is she should go ahead and schedule pt with the next available appointment. ? ?Joni Reining is requesting a call back.  ? ?[phone] ?EXT [phone] ?

## 2021-07-13 NOTE — Progress Notes (Signed)
Pt presents to reestablish care states that has not had BP meds in over 2 years  ?

## 2021-07-13 NOTE — Progress Notes (Signed)
? ?Assessment & Plan:  ?Patricia Brooks was seen today for establish care and hypertension. ? ?Diagnoses and all orders for this visit: ? ?Encounter to establish care ? ?Primary hypertension ?-     valsartan (DIOVAN) 40 MG tablet; Take 1 tablet (40 mg total) by mouth daily. ?BP recheck in 3 weeks ? ?Hypokalemia ?-     CMP14+EGFR ? ?History of anemia ?-     CBC with Differential ? ?Dyslipidemia, goal LDL below 100 ?-     Lipid panel ? ?Need for hepatitis C screening test ?-     HCV Ab w Reflex to Quant PCR ? ?Breast cancer screening by mammogram ?-     MS DIGITAL SCREENING TOMO BILATERAL; Future ? ?Family history of thyroid cancer ?-     Thyroid Panel With TSH ? ?Pelvic fullness in female ?-     US PELVIC COMPLETE WITH TRANSVAGINAL; Future ? 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 ? ? ?Patient has been counseled on age-appropriate routine health concerns for screening and prevention. These are reviewed and up-to-date. Referrals have been placed accordingly. Immunizations are up-to-date or declined.    ?Subjective:  ? ?Chief Complaint  ?Patient presents with  ? Establish Care  ? Hypertension  ? ?HPI ?Patricia Brooks 41 y.o. female presents to office today to establish care in follow-up for hypertension. ? ? ?HTN ?Blood pressure is not well controlled. She has been out of blood pressure medication for over a year. Will start her on valsartan 40 mg daily at this time.  ?BP Readings from Last 3 Encounters:  ?07/13/21 (!) 157/89  ?01/17/20 (!) 156/104  ?03/08/19 (!) 160/109  ?  ? ?GU ?She is unsure if she had a hysterectomy in the past or a tubal ligation.  She is also overdue for pap smear. States she had some sort of pelvic procedure in her home country of [place] but unsure what type of surgery or what the surgery was for.  ? 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 Will order chest xray at this time. She denies any UR symptoms.  ? ? ? ?Review of Systems   ?Constitutional:  Negative for fever, malaise/fatigue and weight loss.  ?HENT: Negative.  Negative for nosebleeds.   ?Eyes: Negative.  Negative for blurred vision, double vision and photophobia.  ?Respiratory: Negative.  Negative for cough and shortness of breath.   ?Cardiovascular: Negative.  Negative for chest pain, palpitations and leg swelling.  ?Gastrointestinal:  Positive for constipation. Negative for heartburn, nausea and vomiting.  ?Musculoskeletal: Negative.  Negative for myalgias.  ?Neurological: Negative.  Negative for dizziness, focal weakness, seizures and headaches.  ?Psychiatric/Behavioral: Negative.  Negative for suicidal ideas.   ? ?Past Medical History:  ?Diagnosis Date  ? Anxiety 2012  ? after last pregnancy  ? Hypertension 2013  ? Previously on Lisinopril  ? ? ?Past Surgical History:  ?Procedure Laterality Date  ? CESAREAN SECTION  2007, 2013  ? TUBAL LIGATION  2013  ? [place]  ? ? ?Family History  ?Problem Relation Age of Onset  ? Seizures Mother   ? Kidney Stones Sister   ? Blindness Son   ?     Not clear if blind or just with poor vision  ? Migraines Son   ? Stroke Sister   ?     Recurrent from valvular issue per patient  ? ? ?Social History Reviewed with no changes  to be made today.  ? ?Outpatient Medications Prior to Visit  ?Medication Sig Dispense Refill  ? amLODipine (NORVASC) 10 MG tablet TAKE 1 TABLET (10 MG TOTAL) BY MOUTH DAILY. 90 tablet 1  ? lisinopril-hydrochlorothiazide (ZESTORETIC) 20-25 MG tablet TAKE 1 TABLET BY MOUTH DAILY. 90 tablet 1  ? ?No facility-administered medications prior to visit.  ? ? ?Allergies  ?Allergen Reactions  ? Aspirin Swelling and Rash  ? ? ?   ?Objective:  ?  ?BP (!) 157/89 (BP Location: Left Arm, Patient Position: Sitting, Cuff Size: Normal)   Pulse 86   Temp 98.7 ?F (37.1 ?C)   Resp 18   Ht _0  (1.651 m)   Wt 173 lb 9.6 oz (78.7 kg)   SpO2 100%   BMI 28.89 kg/m?  ?Wt Readings from Last 3 Encounters:  ?07/13/21 173 lb 9.6 oz (78.7 kg)  ?01/17/20  178 lb 3.2 oz (80.8 kg)  ?03/08/19 179 lb 6.4 oz (81.4 kg)  ? ? ?Physical Exam ?Vitals and nursing note reviewed.  ?Constitutional:   ?   Appearance: She is well-developed.  ?HENT:  ?   Head: Normocephalic and atraumatic.  ?Cardiovascular:  ?   Rate and Rhythm: Normal rate and regular rhythm.  ?   Heart sounds: Normal heart sounds. No murmur heard. ?  No friction rub. No gallop.  ?Pulmonary:  ?   Effort: Pulmonary effort is normal. No tachypnea or respiratory distress.  ?   Breath sounds: Normal breath sounds. No decreased breath sounds, wheezing, rhonchi or rales.  ?Chest:  ?   Chest wall: No tenderness.  ?Abdominal:  ?   General: Bowel sounds are normal. There is distension.  ?   Palpations: Abdomen is soft.  ?Musculoskeletal:     ?   General: Normal range of motion.  ?   Cervical back: Normal range of motion.  ?Skin: ?   General: Skin is warm and dry.  ?Neurological:  ?   Mental Status: She is alert and oriented to person, place, and time.  ?   Coordination: Coordination normal.  ?Psychiatric:     ?   Behavior: Behavior normal. Behavior is cooperative.     ?   Thought Content: Thought content normal.     ?   Judgment: Judgment normal.  ? ? ? ? ?   ?Patient has been counseled extensively about nutrition and exercise as well as the importance of adherence with medications and regular follow-up. The patient was given clear instructions to go to ER or return to medical center if symptoms don't improve, worsen or new problems develop. The patient verbalized understanding.  ? ?Follow-up: Return for See luke in in 3 weeks for HTN.  .  ? ?Gildardo Pounds, FNP-BC ?[facility] ?[place], [place] ?[phone]   ?07/13/2021, 10:43 AM ?

## 2021-07-14 ENCOUNTER — Other Ambulatory Visit: Payer: Self-pay

## 2021-07-14 LAB — CBC WITH DIFFERENTIAL/PLATELET
Basophils Absolute: 0.1 10*3/uL (ref 0.0–0.2)
Basos: 1 %
EOS (ABSOLUTE): 0.1 10*3/uL (ref 0.0–0.4)
Eos: 3 %
Hematocrit: 33 % — ABNORMAL LOW (ref 34.0–46.6)
Hemoglobin: 10.2 g/dL — ABNORMAL LOW (ref 11.1–15.9)
Immature Grans (Abs): 0.1 10*3/uL (ref 0.0–0.1)
Immature Granulocytes: 1 %
Lymphocytes Absolute: 1.1 10*3/uL (ref 0.7–3.1)
Lymphs: 22 %
MCH: 23.2 pg — ABNORMAL LOW (ref 26.6–33.0)
MCHC: 30.9 g/dL — ABNORMAL LOW (ref 31.5–35.7)
MCV: 75 fL — ABNORMAL LOW (ref 79–97)
Monocytes Absolute: 0.4 10*3/uL (ref 0.1–0.9)
Monocytes: 8 %
Neutrophils Absolute: 3.2 10*3/uL (ref 1.4–7.0)
Neutrophils: 65 %
Platelets: 316 10*3/uL (ref 150–450)
RBC: 4.4 x10E6/uL (ref 3.77–5.28)
RDW: 15.5 % — ABNORMAL HIGH (ref 11.7–15.4)
WBC: 4.9 10*3/uL (ref 3.4–10.8)

## 2021-07-14 LAB — LIPID PANEL
Chol/HDL Ratio: 2.2 ratio (ref 0.0–4.4)
Cholesterol, Total: 161 mg/dL (ref 100–199)
HDL: 73 mg/dL (ref >39)
LDL Chol Calc (NIH): 75 mg/dL (ref 0–99)
Triglycerides: 66 mg/dL (ref 0–149)
VLDL Cholesterol Cal: 13 mg/dL (ref 5–40)

## 2021-07-14 LAB — CMP14+EGFR
ALT: 15 IU/L (ref 0–32)
AST: 16 IU/L (ref 0–40)
Albumin/Globulin Ratio: 1.6 (ref 1.2–2.2)
Albumin: 4.7 g/dL (ref 3.8–4.8)
Alkaline Phosphatase: 73 IU/L (ref 44–121)
BUN/Creatinine Ratio: 15 (ref 9–23)
BUN: 10 mg/dL (ref 6–24)
Bilirubin Total: 0.3 mg/dL (ref 0.0–1.2)
CO2: 22 mmol/L (ref 20–29)
Calcium: 9.2 mg/dL (ref 8.7–10.2)
Chloride: 107 mmol/L — ABNORMAL HIGH (ref 96–106)
Creatinine, Ser: 0.68 mg/dL (ref 0.57–1.00)
Globulin, Total: 3 g/dL (ref 1.5–4.5)
Glucose: 97 mg/dL (ref 70–99)
Potassium: 3.8 mmol/L (ref 3.5–5.2)
Sodium: 145 mmol/L — ABNORMAL HIGH (ref 134–144)
Total Protein: 7.7 g/dL (ref 6.0–8.5)
eGFR: 113 mL/min/{1.73_m2} (ref >59)

## 2021-07-14 LAB — THYROID PANEL WITH TSH
Free Thyroxine Index: 1.9 (ref 1.2–4.9)
T3 Uptake Ratio: 23 % — ABNORMAL LOW (ref 24–39)
T4, Total: 8.3 ug/dL (ref 4.5–12.0)
TSH: 1.82 u[IU]/mL (ref 0.450–4.500)

## 2021-07-14 LAB — HCV AB W REFLEX TO QUANT PCR: HCV Ab: NONREACTIVE

## 2021-07-14 LAB — HCV INTERPRETATION

## 2021-07-14 NOTE — Telephone Encounter (Signed)
Spoke with Joni Reining at [facility].  ?Verified that patient can have US done at anytime per provider Meredeth Ide.  ?

## 2021-07-17 ENCOUNTER — Other Ambulatory Visit: Payer: Self-pay | Admitting: Nurse Practitioner

## 2021-07-17 ENCOUNTER — Other Ambulatory Visit: Payer: Self-pay

## 2021-07-17 MED ORDER — FERROUS SULFATE 325 (65 FE) MG PO TABS
325.0000 mg | ORAL_TABLET | Freq: Every day | ORAL | 3 refills | Status: DC
  Filled 2021-07-17: qty 30, 30d supply, fill #0

## 2021-07-21 ENCOUNTER — Other Ambulatory Visit: Payer: Self-pay

## 2022-05-11 ENCOUNTER — Ambulatory Visit (HOSPITAL_COMMUNITY)
Admission: EM | Admit: 2022-05-11 | Discharge: 2022-05-11 | Disposition: A | Discharge status: Home / Self Care | Payer: Self-pay | Attending: Internal Medicine | Admitting: Internal Medicine

## 2022-05-11 ENCOUNTER — Encounter (HOSPITAL_COMMUNITY): Payer: Self-pay | Admitting: Emergency Medicine

## 2022-05-11 DIAGNOSIS — I10 Essential (primary) hypertension: Secondary | ICD-10-CM | POA: Insufficient documentation

## 2022-05-11 DIAGNOSIS — R55 Syncope and collapse: Secondary | ICD-10-CM | POA: Insufficient documentation

## 2022-05-11 LAB — BASIC METABOLIC PANEL
Anion gap: 8 (ref 5–15)
BUN: 9 mg/dL (ref 6–20)
CO2: 26 mmol/L (ref 22–32)
Calcium: 8.9 mg/dL (ref 8.9–10.3)
Chloride: 104 mmol/L (ref 98–111)
Creatinine, Ser: 0.61 mg/dL (ref 0.44–1.00)
GFR, Estimated: >60 mL/min (ref >60)
Glucose, Bld: 84 mg/dL (ref 70–99)
Potassium: 3.5 mmol/L (ref 3.5–5.1)
Sodium: 138 mmol/L (ref 135–145)

## 2022-05-11 LAB — CBC
HCT: 33.1 % — ABNORMAL LOW (ref 36.0–46.0)
Hemoglobin: 9.9 g/dL — ABNORMAL LOW (ref 12.0–15.0)
MCH: 22.7 pg — ABNORMAL LOW (ref 26.0–34.0)
MCHC: 29.9 g/dL — ABNORMAL LOW (ref 30.0–36.0)
MCV: 75.7 fL — ABNORMAL LOW (ref 80.0–100.0)
Platelets: 369 10*3/uL (ref 150–400)
RBC: 4.37 MIL/uL (ref 3.87–5.11)
RDW: 15.5 % (ref 11.5–15.5)
WBC: 5.1 10*3/uL (ref 4.0–10.5)
nRBC: 0 % (ref 0.0–0.2)

## 2022-05-11 LAB — CBG MONITORING, ED: Glucose-Capillary: 78 mg/dL (ref 70–99)

## 2022-05-11 MED ORDER — AMLODIPINE BESYLATE 5 MG PO TABS: 5.0000 mg | ORAL_TABLET | Freq: Every day | ORAL | 2 refills | Status: DC

## 2022-05-11 NOTE — ED Triage Notes (Signed)
Pt reports BP went down, really cold, felt dizzy AND FELT sob while at work 2 hours ago. Pt states that was at work and didn't check BP to know number of it being low.

## 2022-05-11 NOTE — ED Provider Notes (Signed)
[facility]    CSN: [id number] Arrival date & time: 05/11/22  1641      History   Chief Complaint Chief Complaint  Patient presents with   Chills    HPI Patricia Brooks is a 42 y.o. female.   Patient presents to urgent care for evaluation after she had a near syncopal episode approximately 2 hours before arriving urgent care while she was at work.  She states that she suddenly came dizzy, sweaty, and experienced lightheadedness.  She did not fully pass out but she did become very nauseous and vomited 1 time during episode.  Emesis is nonbilious/nonbloody.  She states she also had a little bit of chest pain during the incident to the left side of the chest as well as slight shortness of breath.  She denies heart palpitation sensation, vision changes, and headache.  She is not currently experiencing any chest pain and states that the chest pain to the left side of her chest was very short-lived and resolved on its own without intervention.  No history of cardiac problems or pulmonary problems.  She does have a history of hypertension and does not currently take any medications for this because the type of medication she used to take is very expensive.  Denies urinary symptoms, abdominal pain, recent viral URI symptoms, fever/chills, current dizziness, extremity weakness or numbness, back pain, and flank pain.  No recent medication changes or antibiotic/steroid use.  She is not diabetic and denies smoking/drug use.      Past Medical History:  Diagnosis Date   Anxiety 2012   after last pregnancy   Hypertension 2013   Previously on Lisinopril    Patient Active Problem List   Diagnosis Date Noted   Hypertension    Anxiety 03/29/2010    Past Surgical History:  Procedure Laterality Date   CESAREAN SECTION  2007, 2013   TUBAL LIGATION  2013   [place]    OB History   No obstetric history on file.      Home Medications    Prior to Admission medications   Medication  Sig Start Date End Date Taking? Authorizing Provider  amLODipine (NORVASC) 5 MG tablet Take 1 tablet (5 mg total) by mouth daily. 05/11/22  Yes Talbot Grumbling, FNP  ferrous sulfate 325 (65 FE) MG tablet Take 1 tablet (325 mg total) by mouth daily. 07/17/21   Gildardo Pounds, NP  valsartan (DIOVAN) 40 MG tablet Take 1 tablet (40 mg total) by mouth daily. 07/13/21 08/12/21  Gildardo Pounds, NP    Family History Family History  Problem Relation Age of Onset   Seizures Mother    Kidney Stones Sister    Blindness Son        Not clear if blind or just with poor vision   Migraines Son    Stroke Sister        Recurrent from valvular issue per patient    Social History Social History   Tobacco Use   Smoking status: Never   Smokeless tobacco: Never  Substance Use Topics   Alcohol use: No   Drug use: No     Allergies   Aspirin   Review of Systems Review of Systems Per HPI  Physical Exam Triage Vital Signs ED Triage Vitals  Enc Vitals Group     BP 05/11/22 1819 (!) 185/127     Pulse Rate 05/11/22 1819 78     Resp 05/11/22 1819 18     Temp  05/11/22 1821 98.7 F (37.1 C)     Temp Source 05/11/22 1819 Oral     SpO2 05/11/22 1819 98 %     Weight --      Height --      Head Circumference --      Peak Flow --      Pain Score 05/11/22 1816 0     Pain Loc --      Pain Edu? --      Excl. in [place]? --    No data found.  Updated Vital Signs BP (!) 161/104 (BP Location: Right Arm)   Pulse 78   Temp 98.7 F (37.1 C) (Oral)   Resp 18   LMP 05/10/2022   SpO2 98%   Visual Acuity Right Eye Distance:   Left Eye Distance:   Bilateral Distance:    Right Eye Near:   Left Eye Near:    Bilateral Near:     Physical Exam Vitals and nursing note reviewed.  Constitutional:      Appearance: She is not ill-appearing or toxic-appearing.  HENT:     Head: Normocephalic and atraumatic.     Right Ear: Hearing, tympanic membrane, ear canal and external ear normal.     Left  Ear: Hearing, tympanic membrane, ear canal and external ear normal.     Nose: Nose normal.     Mouth/Throat:     Lips: Pink.     Mouth: Mucous membranes are moist. No injury.     Tongue: No lesions. Tongue does not deviate from midline.     Palate: No mass and lesions.     Pharynx: Oropharynx is clear. Uvula midline. No pharyngeal swelling, oropharyngeal exudate, posterior oropharyngeal erythema or uvula swelling.     Tonsils: No tonsillar exudate or tonsillar abscesses.  Eyes:     General: Lids are normal. Vision grossly intact. Gaze aligned appropriately.     Extraocular Movements: Extraocular movements intact.     Conjunctiva/sclera: Conjunctivae normal.  Cardiovascular:     Rate and Rhythm: Normal rate and regular rhythm.     Heart sounds: Normal heart sounds, S1 normal and S2 normal.  Pulmonary:     Effort: Pulmonary effort is normal. No respiratory distress.     Breath sounds: Normal breath sounds and air entry. No wheezing, rhonchi or rales.  Abdominal:     General: Bowel sounds are normal.     Palpations: Abdomen is soft.     Tenderness: There is no abdominal tenderness. There is no right CVA tenderness, left CVA tenderness or guarding.  Musculoskeletal:     Cervical back: Neck supple.     Right lower leg: No edema.     Left lower leg: No edema.  Lymphadenopathy:     Cervical: No cervical adenopathy.  Skin:    General: Skin is warm and dry.     Capillary Refill: Capillary refill takes less than 2 seconds.     Findings: No rash.  Neurological:     General: No focal deficit present.     Mental Status: She is alert and oriented to person, place, and time. Mental status is at baseline.     Cranial Nerves: No cranial nerve deficit, dysarthria or facial asymmetry.     Motor: No weakness.     Coordination: Coordination normal.     Gait: Gait normal.     Comments: Nonfocal neuroexam.  Moves all 4 extremities with normal coordination voluntarily.  Psychiatric:  Mood and  Affect: Mood normal.        Speech: Speech normal.        Behavior: Behavior normal.        Thought Content: Thought content normal.        Judgment: Judgment normal.      UC Treatments / Results  Labs (all labs ordered are listed, but only abnormal results are displayed) Labs Reviewed  CBC  BASIC METABOLIC PANEL  CBG MONITORING, ED    EKG   Radiology No results found.  Procedures Procedures (including critical care time)  Medications Ordered in UC Medications - No data to display  Initial Impression / Assessment and Plan / UC Course  I have reviewed the triage vital signs and the nursing notes.  Pertinent labs & imaging results that were available during my care of the patient were reviewed by me and considered in my medical decision making (see chart for details).   1.  Near syncope Unclear etiology of patient's near syncopal episode today, however increased life stressors and dehydration likely contributing factors and today's episode.  Basic blood work drawn to assess for blood level abnormalities as well as electrolyte abnormalities contributing to patient's symptoms.  Advised to increase water intake to at least 64 ounces of water to stay well-hydrated and decrease coffee intake as this is likely contributing to her symptoms.  EKG performed due to near syncope and dizziness/chest pain is without red flag ST/T wave changes indicating need for emergent referral to the emergency department for further workup and evaluation.  Deferred imaging based on stable cardiopulmonary exam and hemodynamically stable vital signs in clinic.  Advised to report to the nearest emergency department should she develop any new or worsening symptoms.   2.  Essential hypertension Blood pressure has been very elevated at previous visits and is currently elevated at 185/127 initially, then 161/104 on recheck.  I would like to start patient on amlodipine blood pressure medication today and have her  take this once daily.  Advised to purchase a blood pressure cuff and take her blood pressure 3-4 times weekly/write these numbers down in a notebook to bring to her PCP appointment once she is able to establish care and follow-up with PCP for ongoing evaluation of hypertension management.  She is not exhibiting or displaying any red flag signs/symptoms indicating need for emergent referral to the emergency department for further workup and evaluation.  Advised to lower salt intake and increase exercise to promote healthy lifestyle changes and assist in lowering blood pressure.  Discussed physical exam and available lab work findings in clinic with patient.  Counseled patient regarding appropriate use of medications and potential side effects for all medications recommended or prescribed today. Discussed red flag signs and symptoms of worsening condition,when to call the PCP office, return to urgent care, and when to seek higher level of care in the emergency department. Patient verbalizes understanding and agreement with plan. All questions answered. Patient discharged in stable condition.    Final Clinical Impressions(s) / UC Diagnoses   Final diagnoses:  Near syncope  Essential hypertension     Discharge Instructions      Please drink more water.  I believe that is why you almost passed out today.  Drink less coffee as this will cause you to be more dehydrated.  I have drawn blood work in the clinic to evaluate for electrolyte imbalances.  Your blood sugar looks fine in the clinic.  Your EKG also looks great.  I would like for you to follow-up with your primary care provider for ongoing evaluation of your high blood pressure and other medical problems.  Please start taking amlodipine 5 mg once daily to lower your blood pressure.  Limit the amount of salt in your diet and increase your exercise to lower your blood pressure naturally as well.  If you develop any new or worsening symptoms  or do not improve in the next 2 to 3 days, please return.  If your symptoms are severe, please go to the emergency room.  Follow-up with your primary care provider for further evaluation and management of your symptoms as well as ongoing wellness visits.  I hope you feel better!     ED Prescriptions     Medication Sig Dispense Auth. Provider   amLODipine (NORVASC) 5 MG tablet Take 1 tablet (5 mg total) by mouth daily. 30 tablet Talbot Grumbling, FNP      PDMP not reviewed this encounter.   Talbot Grumbling, [place] 05/16/22 2134

## 2022-05-11 NOTE — Discharge Instructions (Signed)
Please drink more water.  I believe that is why you almost passed out today.  Drink less coffee as this will cause you to be more dehydrated.  I have drawn blood work in the clinic to evaluate for electrolyte imbalances.  Your blood sugar looks fine in the clinic.  Your EKG also looks great.  I would like for you to follow-up with your primary care provider for ongoing evaluation of your high blood pressure and other medical problems.  Please start taking amlodipine 5 mg once daily to lower your blood pressure.  Limit the amount of salt in your diet and increase your exercise to lower your blood pressure naturally as well.  If you develop any new or worsening symptoms or do not improve in the next 2 to 3 days, please return.  If your symptoms are severe, please go to the emergency room.  Follow-up with your primary care provider for further evaluation and management of your symptoms as well as ongoing wellness visits.  I hope you feel better!

## 2023-03-02 ENCOUNTER — Other Ambulatory Visit (HOSPITAL_COMMUNITY): Payer: Self-pay

## 2023-07-28 ENCOUNTER — Ambulatory Visit: Admitting: Physician Assistant

## 2023-12-28 ENCOUNTER — Telehealth: Payer: Self-pay | Admitting: Nurse Practitioner

## 2023-12-28 NOTE — Telephone Encounter (Signed)
 Called pt, pt will be present at appt.

## 2024-01-02 ENCOUNTER — Encounter: Payer: Self-pay | Admitting: Nurse Practitioner

## 2024-01-02 ENCOUNTER — Ambulatory Visit: Attending: Nurse Practitioner | Admitting: Nurse Practitioner

## 2024-01-02 VITALS — BP 178/101 | HR 76 | Resp 19 | Ht 65.0 in | Wt 175.0 lb

## 2024-01-02 DIAGNOSIS — N939 Abnormal uterine and vaginal bleeding, unspecified: Secondary | ICD-10-CM

## 2024-01-02 DIAGNOSIS — I10 Essential (primary) hypertension: Secondary | ICD-10-CM | POA: Diagnosis not present

## 2024-01-02 DIAGNOSIS — Z23 Encounter for immunization: Secondary | ICD-10-CM | POA: Diagnosis not present

## 2024-01-02 DIAGNOSIS — E7800 Pure hypercholesterolemia, unspecified: Secondary | ICD-10-CM | POA: Diagnosis not present

## 2024-01-02 DIAGNOSIS — Z91148 Patient's other noncompliance with medication regimen for other reason: Secondary | ICD-10-CM

## 2024-01-02 DIAGNOSIS — Z1231 Encounter for screening mammogram for malignant neoplasm of breast: Secondary | ICD-10-CM

## 2024-01-02 DIAGNOSIS — D649 Anemia, unspecified: Secondary | ICD-10-CM | POA: Diagnosis not present

## 2024-01-02 MED ORDER — AMLODIPINE BESYLATE 10 MG PO TABS: 10.0000 mg | ORAL_TABLET | Freq: Every day | ORAL | 1 refills | Status: AC

## 2024-01-02 MED ORDER — VALSARTAN 40 MG PO TABS: 40.0000 mg | ORAL_TABLET | Freq: Every day | ORAL | 1 refills | Status: AC

## 2024-01-02 MED ORDER — FERROUS SULFATE 325 (65 FE) MG PO TABS: 325.0000 mg | ORAL_TABLET | Freq: Every day | ORAL | 1 refills | Status: AC

## 2024-01-02 NOTE — Progress Notes (Signed)
 Assessment & Plan:  Patricia Brooks was seen today for headache and loss of consciousness.  Diagnoses and all orders for this visit:  Primary hypertension -     CMP14+EGFR -     amLODipine  (NORVASC ) 10 MG tablet; Take 1 tablet (10 mg total) by mouth daily. -     valsartan  (DIOVAN ) 40 MG tablet; Take 1 tablet (40 mg total) by mouth daily. Essential hypertension with symptoms of dizziness, headaches, and difficulty breathing. Not on medication and lacks home blood pressure monitor. - Prescribe blood pressure medication and send prescription to [facility] pharmacy. - Advise obtaining a home blood pressure monitor.   Need for influenza vaccination -     Flu vaccine trivalent PF, 6mos and older(Flulaval,Afluria,Fluarix,Fluzone)  Anemia, unspecified type -     CBC with Differential -     ferrous sulfate  325 (65 FE) MG tablet; Take 1 tablet (325 mg total) by mouth daily. Anemia suspected due to heavy menstrual bleeding. - Order blood tests to confirm anemia and assess severity.   Hypercholesterolemia -     Lipid panel INSTRUCTIONS: Work on a low fat, heart healthy diet and participate in regular aerobic exercise program by working out at least 150 minutes per week; 5 days a week-30 minutes per day. Avoid red meat/beef/steak,  fried foods. junk foods, sodas, sugary drinks, unhealthy snacking, alcohol and smoking.  Drink at least 80 oz of water per day and monitor your carbohydrate intake daily.    Abnormal uterine bleeding (AUB) -     US  PELVIC COMPLETE WITH TRANSVAGINAL; Future -     Ambulatory referral to Gynecology Heavy and irregular menstrual bleeding - Refer to gynecology for evaluation and management. - Order pelvic ultrasound to assess for uterine fibroids.   Encounter for screening mammogram for malignant neoplasm of breast -     MS 3D SCR MAMMO BILAT BR (aka MM); Future    Patient has been counseled on age-appropriate routine health concerns for screening and prevention. These  are reviewed and up-to-date. Referrals have been placed accordingly. Immunizations are up-to-date or declined.    Subjective:   Chief Complaint  Patient presents with   Headache    Blurry vision and dizziness.    Loss of Consciousness    Patient stated she fainted twice last December.     Patricia Brooks 43 y.o. female presents to office today for follow up to HTN I have not seen her in this office in over 2 years  She has a past medical history of Anxiety (2012), anemia and Hypertension (2013).    HTN Blood pressure significantly elevated.  She is not taking either amlodipine  or valsartan  which were prescribed for her in the past. She experiences dizziness, headaches, and difficulty breathing, particularly in the mornings, which she associates with her blood pressure.  Although she does not currently monitor her blood pressure at home but notes feeling better in the afternoons. She experiences coldness and shaking when she believes her blood pressure is low and has not been taking her blood pressure medication regularly. BP Readings from Last 3 Encounters:  01/02/24 (!) 178/101  05/11/22 (!) 161/104  07/13/21 (!) 157/89     AUB She experiences heavy menstrual bleeding, which is prolonged and irregular since turning 40. She reports decreased libido, attributing it to stress and anxiety related to work and family responsibilities. She lacks desire for intercourse and is concerned about the impact of stress on her libido.  She has a history of constipation since childhood,  managed with a diet rich in greens and fruits, including papaya and beets. Despite these efforts, she experiences difficulty with bowel movements, feeling 'heavy' and experiencing abdominal distension when unable to defecate.   Review of Systems  Constitutional:  Negative for fever, malaise/fatigue and weight loss.  HENT: Negative.  Negative for nosebleeds.   Eyes: Negative.  Negative for blurred vision, double  vision and photophobia.  Respiratory: Negative.  Negative for cough and shortness of breath.   Cardiovascular: Negative.  Negative for chest pain, palpitations and leg swelling.  Gastrointestinal:  Positive for constipation. Negative for heartburn, nausea and vomiting.  Genitourinary:        See HPI  Musculoskeletal: Negative.  Negative for myalgias.  Neurological:  Positive for dizziness and headaches. Negative for focal weakness and seizures.  Psychiatric/Behavioral: Negative.  Negative for suicidal ideas.     Past Medical History:  Diagnosis Date   Anxiety 2012   after last pregnancy   Hypertension 2013   Previously on Lisinopril     Past Surgical History:  Procedure Laterality Date   CESAREAN SECTION  2007, 2013   TUBAL LIGATION  2013   [place]    Family History  Problem Relation Age of Onset   Seizures Mother    Kidney Stones Sister    Blindness Son        Not clear if blind or just with poor vision   Migraines Son    Stroke Sister        Recurrent from valvular issue per patient    Social History Reviewed with no changes to be made today.   Outpatient Medications Prior to Visit  Medication Sig Dispense Refill   amLODipine  (NORVASC ) 5 MG tablet Take 1 tablet (5 mg total) by mouth daily. (Patient not taking: Reported on 01/02/2024) 30 tablet 2   ferrous sulfate  325 (65 FE) MG tablet Take 1 tablet (325 mg total) by mouth daily. (Patient not taking: Reported on 01/02/2024) 30 tablet 3   valsartan  (DIOVAN ) 40 MG tablet Take 1 tablet (40 mg total) by mouth daily. (Patient not taking: Reported on 01/02/2024) 30 tablet 0   No facility-administered medications prior to visit.    Allergies  Allergen Reactions   Aspirin Swelling and Rash       Objective:    BP (!) 178/101 (BP Location: Left Arm, Patient Position: Sitting, Cuff Size: Normal)   Pulse 76   Resp 19   Ht 5' 5 (1.651 m)   Wt 175 lb (79.4 kg)   LMP 12/28/2023 (Exact Date)   SpO2 100%   BMI 29.12 kg/m   Wt Readings from Last 3 Encounters:  01/02/24 175 lb (79.4 kg)  07/13/21 173 lb 9.6 oz (78.7 kg)  01/17/20 178 lb 3.2 oz (80.8 kg)    Physical Exam Vitals and nursing note reviewed.  Constitutional:      Appearance: She is well-developed.  HENT:     Head: Normocephalic and atraumatic.  Cardiovascular:     Rate and Rhythm: Normal rate and regular rhythm.     Heart sounds: Normal heart sounds. No murmur heard.    No friction rub. No gallop.  Pulmonary:     Effort: Pulmonary effort is normal. No tachypnea or respiratory distress.     Breath sounds: Normal breath sounds. No decreased breath sounds, wheezing, rhonchi or rales.  Chest:     Chest wall: No tenderness.  Musculoskeletal:        General: Normal range of motion.  Cervical back: Normal range of motion.  Skin:    General: Skin is warm and dry.  Neurological:     Mental Status: She is alert and oriented to person, place, and time.     Coordination: Coordination normal.  Psychiatric:        Behavior: Behavior normal. Behavior is cooperative.        Thought Content: Thought content normal.        Judgment: Judgment normal.          Patient has been counseled extensively about nutrition and exercise as well as the importance of adherence with medications and regular follow-up. The patient was given clear instructions to go to ER or return to medical center if symptoms don't improve, worsen or new problems develop. The patient verbalized understanding.   Follow-up: Return in about 4 weeks (around 01/30/2024) for BP recheck.   Haze LELON Servant, FNP-BC [facility] [place], [place] [phone]   01/02/2024, 2:22 PM

## 2024-01-02 NOTE — Patient Instructions (Signed)
 [facility] [address] [place]  [phone]

## 2024-01-03 ENCOUNTER — Ambulatory Visit: Payer: Self-pay | Admitting: Nurse Practitioner

## 2024-01-03 LAB — CMP14+EGFR
ALT: 17 IU/L (ref 0–32)
AST: 21 IU/L (ref 0–40)
Albumin: 4.6 g/dL (ref 3.9–4.9)
Alkaline Phosphatase: 86 IU/L (ref 41–116)
BUN/Creatinine Ratio: 16 (ref 9–23)
BUN: 11 mg/dL (ref 6–24)
Bilirubin Total: 0.4 mg/dL (ref 0.0–1.2)
CO2: 24 mmol/L (ref 20–29)
Calcium: 9.4 mg/dL (ref 8.7–10.2)
Chloride: 101 mmol/L (ref 96–106)
Creatinine, Ser: 0.69 mg/dL (ref 0.57–1.00)
Globulin, Total: 3.4 g/dL (ref 1.5–4.5)
Glucose: 92 mg/dL (ref 70–99)
Potassium: 3.8 mmol/L (ref 3.5–5.2)
Sodium: 140 mmol/L (ref 134–144)
Total Protein: 8 g/dL (ref 6.0–8.5)
eGFR: 111 mL/min/1.73 (ref >59)

## 2024-01-03 LAB — CBC WITH DIFFERENTIAL/PLATELET
Basophils Absolute: 0 x10E3/uL (ref 0.0–0.2)
Basos: 1 %
EOS (ABSOLUTE): 0.2 x10E3/uL (ref 0.0–0.4)
Eos: 4 %
Hematocrit: 37.6 % (ref 34.0–46.6)
Hemoglobin: 11.3 g/dL (ref 11.1–15.9)
Immature Grans (Abs): 0 x10E3/uL (ref 0.0–0.1)
Immature Granulocytes: 0 %
Lymphocytes Absolute: 1.3 x10E3/uL (ref 0.7–3.1)
Lymphs: 31 %
MCH: 24.3 pg — ABNORMAL LOW (ref 26.6–33.0)
MCHC: 30.1 g/dL — ABNORMAL LOW (ref 31.5–35.7)
MCV: 81 fL (ref 79–97)
Monocytes Absolute: 0.4 x10E3/uL (ref 0.1–0.9)
Monocytes: 9 %
Neutrophils Absolute: 2.4 x10E3/uL (ref 1.4–7.0)
Neutrophils: 55 %
Platelets: 292 x10E3/uL (ref 150–450)
RBC: 4.65 x10E6/uL (ref 3.77–5.28)
RDW: 16.3 % — ABNORMAL HIGH (ref 11.7–15.4)
WBC: 4.2 x10E3/uL (ref 3.4–10.8)

## 2024-01-03 LAB — LIPID PANEL
Chol/HDL Ratio: 3.2 ratio (ref 0.0–4.4)
Cholesterol, Total: 209 mg/dL — ABNORMAL HIGH (ref 100–199)
HDL: 66 mg/dL (ref >39)
LDL Chol Calc (NIH): 127 mg/dL — ABNORMAL HIGH (ref 0–99)
Triglycerides: 88 mg/dL (ref 0–149)
VLDL Cholesterol Cal: 16 mg/dL (ref 5–40)

## 2024-02-13 ENCOUNTER — Ambulatory Visit: Admitting: Nurse Practitioner

## 2024-03-19 ENCOUNTER — Telehealth: Payer: Self-pay | Admitting: Nurse Practitioner

## 2024-03-19 NOTE — Telephone Encounter (Signed)
 Copied from CRM [id number]. Topic: Referral - Request for Referral >> Mar 19, 2024 10:02 AM Hadassah PARAS wrote:  Did the patient discuss referral with their provider in the last year? Yes (If No - schedule appointment) (If Yes - send message)  Appointment offered? No  Type of order/referral and detailed reason for visit: Period has not came yet. Pt believes she is possibly pregnant, thyoid disorder or menopause. Would like to see an OBGYN  Preference of office, provider, location: unknown   If referral order, have you been seen by this specialty before? No (If Yes, this issue or another issue? When? Where?  Can we respond through MyChart? No, please call [phone]

## 2024-03-19 NOTE — Telephone Encounter (Signed)
 Patient identified by name and date of birth.  Patient given contact information for gynecologist.
